# Patient Record
Sex: Female | Born: 1935 | Race: Black or African American | Hispanic: No | Smoking: Never smoker
Health system: Southern US, Community
[De-identification: ages and names within clinical notes are randomized; demographics above are authoritative.]

## PROBLEM LIST (undated history)

## (undated) DIAGNOSIS — M1990 Unspecified osteoarthritis, unspecified site: Secondary | ICD-10-CM

## (undated) DIAGNOSIS — I10 Essential (primary) hypertension: Secondary | ICD-10-CM

## (undated) DIAGNOSIS — I350 Nonrheumatic aortic (valve) stenosis: Secondary | ICD-10-CM

## (undated) DIAGNOSIS — C50911 Malignant neoplasm of unspecified site of right female breast: Secondary | ICD-10-CM

## (undated) DIAGNOSIS — E079 Disorder of thyroid, unspecified: Secondary | ICD-10-CM

## (undated) HISTORY — DX: Nonrheumatic aortic (valve) stenosis: I35.0

## (undated) HISTORY — DX: Essential (primary) hypertension: I10

## (undated) HISTORY — PX: BREAST BIOPSY: SHX20

## (undated) HISTORY — PX: INGUINAL HERNIA REPAIR: SUR1180

## (undated) HISTORY — PX: CHOLECYSTECTOMY OPEN: SUR202

## (undated) HISTORY — DX: Unspecified osteoarthritis, unspecified site: M19.90

## (undated) HISTORY — PX: CATARACT EXTRACTION W/ INTRAOCULAR LENS IMPLANT: SHX1309

## (undated) HISTORY — PX: TONSILLECTOMY AND ADENOIDECTOMY: SUR1326

## (undated) HISTORY — PX: FRACTURE SURGERY: SHX138

## (undated) HISTORY — PX: APPENDECTOMY: SHX54

## (undated) HISTORY — DX: Disorder of thyroid, unspecified: E07.9

## (undated) HISTORY — PX: ABDOMINAL HYSTERECTOMY: SHX81

---

## 1995-12-04 DIAGNOSIS — C50911 Malignant neoplasm of unspecified site of right female breast: Secondary | ICD-10-CM

## 1995-12-04 HISTORY — DX: Malignant neoplasm of unspecified site of right female breast: C50.911

## 1996-06-18 HISTORY — PX: MASTECTOMY: SHX3

## 1998-11-22 ENCOUNTER — Ambulatory Visit (HOSPITAL_COMMUNITY): Admission: RE | Admit: 1998-11-22 | Discharge: 1998-11-22 | Payer: Self-pay | Admitting: *Deleted

## 1999-11-10 ENCOUNTER — Ambulatory Visit (HOSPITAL_COMMUNITY): Admission: RE | Admit: 1999-11-10 | Discharge: 1999-11-10 | Payer: Self-pay | Admitting: Family Medicine

## 1999-11-10 ENCOUNTER — Encounter: Payer: Self-pay | Admitting: Family Medicine

## 2000-11-11 ENCOUNTER — Ambulatory Visit (HOSPITAL_COMMUNITY): Admission: RE | Admit: 2000-11-11 | Discharge: 2000-11-11 | Payer: Self-pay | Admitting: Obstetrics and Gynecology

## 2000-11-11 ENCOUNTER — Encounter: Payer: Self-pay | Admitting: Obstetrics and Gynecology

## 2001-11-13 ENCOUNTER — Ambulatory Visit (HOSPITAL_COMMUNITY): Admission: RE | Admit: 2001-11-13 | Discharge: 2001-11-13 | Payer: Self-pay | Admitting: *Deleted

## 2002-11-12 ENCOUNTER — Ambulatory Visit (HOSPITAL_COMMUNITY): Admission: RE | Admit: 2002-11-12 | Discharge: 2002-11-12 | Payer: Self-pay | Admitting: *Deleted

## 2002-12-10 ENCOUNTER — Other Ambulatory Visit: Admission: RE | Admit: 2002-12-10 | Discharge: 2002-12-10 | Payer: Self-pay | Admitting: Family Medicine

## 2003-11-15 ENCOUNTER — Ambulatory Visit (HOSPITAL_COMMUNITY): Admission: RE | Admit: 2003-11-15 | Discharge: 2003-11-15 | Payer: Self-pay | Admitting: Obstetrics and Gynecology

## 2004-11-29 ENCOUNTER — Ambulatory Visit (HOSPITAL_COMMUNITY): Admission: RE | Admit: 2004-11-29 | Discharge: 2004-11-29 | Payer: Self-pay | Admitting: Obstetrics and Gynecology

## 2005-12-18 ENCOUNTER — Ambulatory Visit (HOSPITAL_COMMUNITY): Admission: RE | Admit: 2005-12-18 | Discharge: 2005-12-18 | Payer: Self-pay | Admitting: *Deleted

## 2006-01-07 ENCOUNTER — Other Ambulatory Visit: Admission: RE | Admit: 2006-01-07 | Discharge: 2006-01-07 | Payer: Self-pay | Admitting: Obstetrics and Gynecology

## 2007-01-13 ENCOUNTER — Ambulatory Visit (HOSPITAL_COMMUNITY): Admission: RE | Admit: 2007-01-13 | Discharge: 2007-01-13 | Payer: Self-pay | Admitting: *Deleted

## 2007-01-27 ENCOUNTER — Encounter: Admission: RE | Admit: 2007-01-27 | Discharge: 2007-01-27 | Payer: Self-pay | Admitting: *Deleted

## 2008-02-20 ENCOUNTER — Ambulatory Visit (HOSPITAL_COMMUNITY): Admission: RE | Admit: 2008-02-20 | Discharge: 2008-02-20 | Payer: Self-pay | Admitting: *Deleted

## 2009-03-11 ENCOUNTER — Other Ambulatory Visit: Admission: RE | Admit: 2009-03-11 | Discharge: 2009-03-11 | Payer: Self-pay | Admitting: Family Medicine

## 2009-12-04 ENCOUNTER — Emergency Department (HOSPITAL_COMMUNITY): Admission: EM | Admit: 2009-12-04 | Discharge: 2009-12-04 | Payer: Self-pay | Admitting: Emergency Medicine

## 2010-03-02 ENCOUNTER — Encounter: Admission: RE | Admit: 2010-03-02 | Discharge: 2010-03-02 | Payer: Self-pay | Admitting: General Surgery

## 2010-03-16 ENCOUNTER — Ambulatory Visit (HOSPITAL_COMMUNITY): Admission: RE | Admit: 2010-03-16 | Discharge: 2010-03-16 | Payer: Self-pay | Admitting: General Surgery

## 2011-03-12 ENCOUNTER — Other Ambulatory Visit (HOSPITAL_COMMUNITY): Payer: Self-pay | Admitting: Obstetrics and Gynecology

## 2011-03-12 DIAGNOSIS — Z1231 Encounter for screening mammogram for malignant neoplasm of breast: Secondary | ICD-10-CM

## 2011-03-21 ENCOUNTER — Ambulatory Visit (HOSPITAL_COMMUNITY)
Admission: RE | Admit: 2011-03-21 | Discharge: 2011-03-21 | Disposition: A | Discharge status: Home / Self Care | Payer: Medicare Other | Source: Ambulatory Visit | Attending: Obstetrics and Gynecology | Admitting: Obstetrics and Gynecology

## 2011-03-21 DIAGNOSIS — Z1231 Encounter for screening mammogram for malignant neoplasm of breast: Secondary | ICD-10-CM

## 2011-07-23 ENCOUNTER — Emergency Department (HOSPITAL_COMMUNITY): Payer: Medicare Other

## 2011-07-23 ENCOUNTER — Emergency Department (HOSPITAL_COMMUNITY)
Admission: EM | Admit: 2011-07-23 | Discharge: 2011-07-23 | Disposition: A | Discharge status: Home / Self Care | Payer: Medicare Other | Attending: Emergency Medicine | Admitting: Emergency Medicine

## 2011-07-23 DIAGNOSIS — I10 Essential (primary) hypertension: Secondary | ICD-10-CM | POA: Insufficient documentation

## 2011-07-23 DIAGNOSIS — R109 Unspecified abdominal pain: Secondary | ICD-10-CM | POA: Insufficient documentation

## 2011-07-23 DIAGNOSIS — M545 Low back pain, unspecified: Secondary | ICD-10-CM | POA: Insufficient documentation

## 2011-07-23 DIAGNOSIS — S139XXA Sprain of joints and ligaments of unspecified parts of neck, initial encounter: Secondary | ICD-10-CM | POA: Insufficient documentation

## 2011-07-23 DIAGNOSIS — S335XXA Sprain of ligaments of lumbar spine, initial encounter: Secondary | ICD-10-CM | POA: Insufficient documentation

## 2011-07-23 DIAGNOSIS — M542 Cervicalgia: Secondary | ICD-10-CM | POA: Insufficient documentation

## 2011-07-23 LAB — URINALYSIS, ROUTINE W REFLEX MICROSCOPIC
Hgb urine dipstick: NEGATIVE
Nitrite: NEGATIVE
Protein, ur: NEGATIVE mg/dL
Urobilinogen, UA: 1 mg/dL (ref 0.0–1.0)

## 2011-07-23 LAB — URINE MICROSCOPIC-ADD ON

## 2012-03-25 ENCOUNTER — Encounter (INDEPENDENT_AMBULATORY_CARE_PROVIDER_SITE_OTHER): Payer: Self-pay | Admitting: General Surgery

## 2012-04-01 ENCOUNTER — Ambulatory Visit (INDEPENDENT_AMBULATORY_CARE_PROVIDER_SITE_OTHER): Payer: Medicare Other | Admitting: General Surgery

## 2012-04-01 ENCOUNTER — Encounter (INDEPENDENT_AMBULATORY_CARE_PROVIDER_SITE_OTHER): Payer: Self-pay | Admitting: General Surgery

## 2012-04-01 VITALS — BP 188/96 | HR 64 | Resp 16 | Ht <= 58 in | Wt 142.8 lb

## 2012-04-01 DIAGNOSIS — Z853 Personal history of malignant neoplasm of breast: Secondary | ICD-10-CM | POA: Insufficient documentation

## 2012-04-01 NOTE — Patient Instructions (Signed)
Continue to do regular self exams 

## 2012-04-01 NOTE — Progress Notes (Signed)
Subjective:     Patient ID: Krystal Chambers, female   DOB: Aug 24, 1935, 77 y.o.   MRN: [id number]  HPI The patient is a 77 year old black female who is 16 years out from a right modified radical mastectomy for a T2 N0 right breast cancer. In the last year she has been well. She has had no major illnesses. She does report pain and some of her joints from arthritis but no chest wall pain. Her last mammogram was done near the end of 2012 which was reported as negative. She also had an ultrasound last year her thyroid gland which shows some very small nodules that were too small to really biopsy. She has had no difficulty swallowing. She has had no hot or cold intolerance. Her medical doctor follows her thyroid function levels.  Review of Systems  Constitutional: Negative.   HENT: Negative.   Eyes: Negative.   Respiratory: Negative.   Cardiovascular: Negative.   Gastrointestinal: Negative.   Genitourinary: Negative.   Musculoskeletal: Negative.   Skin: Negative.   Neurological: Negative.   Hematological: Negative.   Psychiatric/Behavioral: Negative.        Objective:   Physical Exam  Constitutional: She is oriented to person, place, and time. She appears well-developed and well-nourished.  HENT:  Head: Normocephalic and atraumatic.  Eyes: Conjunctivae and EOM are normal.       She has a small red swollen area on her lower left eyelid  Neck: Normal range of motion. Neck supple. No thyromegaly present.  Cardiovascular: Normal rate, regular rhythm and normal heart sounds.   Pulmonary/Chest: Effort normal and breath sounds normal.       There is no palpable mass of the right chest wall. No palpable mass of the left breast. No palpable axillary supraclavicular or cervical lymphadenopathy.  Abdominal: Soft. Bowel sounds are normal. She exhibits no mass. There is no tenderness.  Musculoskeletal: Normal range of motion.  Lymphadenopathy:    She has no cervical adenopathy.  Neurological: She is  alert and oriented to person, place, and time.  Skin: Skin is warm and dry.  Psychiatric: She has a normal mood and affect. Her behavior is normal.       Assessment:     16 years out from a right modified radical mastectomy. She also has a history of very small nodules in her thyroid gland that are asymptomatic.    Plan:     Today I do not palpate any abnormalities with her thyroid gland. Her medical doctor is following her thyroid function studies. From a breast cancer standpoint she's doing very well. She will continue to do regular self exams. We will plan to see her back in one year

## 2013-02-11 ENCOUNTER — Encounter (INDEPENDENT_AMBULATORY_CARE_PROVIDER_SITE_OTHER): Payer: Self-pay | Admitting: General Surgery

## 2013-05-11 ENCOUNTER — Ambulatory Visit (INDEPENDENT_AMBULATORY_CARE_PROVIDER_SITE_OTHER): Payer: Medicare Other | Admitting: General Surgery

## 2013-05-25 ENCOUNTER — Ambulatory Visit (INDEPENDENT_AMBULATORY_CARE_PROVIDER_SITE_OTHER): Payer: Medicare Other | Admitting: General Surgery

## 2013-06-02 ENCOUNTER — Encounter (INDEPENDENT_AMBULATORY_CARE_PROVIDER_SITE_OTHER): Payer: Self-pay | Admitting: General Surgery

## 2013-06-02 ENCOUNTER — Ambulatory Visit (INDEPENDENT_AMBULATORY_CARE_PROVIDER_SITE_OTHER): Payer: Medicare Other | Admitting: General Surgery

## 2013-06-02 VITALS — BP 176/94 | HR 52 | Temp 99.1°F | Resp 16 | Ht 59.0 in | Wt 146.0 lb

## 2013-06-02 DIAGNOSIS — Z853 Personal history of malignant neoplasm of breast: Secondary | ICD-10-CM

## 2013-06-02 NOTE — Patient Instructions (Signed)
Continue regular self exams Will order mammogram

## 2013-06-02 NOTE — Progress Notes (Signed)
Subjective:     Patient ID: Krystal Chambers, female   DOB: May 15, 1935, 78 y.o.   MRN: [id number]  HPI The patient is a 78 year old black female who is 17 years status post right modified radical mastectomy for a T2 N0 right breast cancer. She has been very well for the last year. She has had no major medical problems. She denies any chest wall discomfort. Her last mammogram was in 2012.  Review of Systems  Constitutional: Negative.   HENT: Negative.   Eyes: Negative.   Respiratory: Negative.   Cardiovascular: Negative.   Gastrointestinal: Negative.   Endocrine: Negative.   Genitourinary: Negative.   Musculoskeletal: Negative.   Skin: Negative.   Allergic/Immunologic: Negative.   Neurological: Negative.   Hematological: Negative.   Psychiatric/Behavioral: Negative.        Objective:   Physical Exam  Constitutional: She is oriented to person, place, and time. She appears well-developed and well-nourished.  HENT:  Head: Normocephalic and atraumatic.  Eyes: Conjunctivae and EOM are normal. Pupils are equal, round, and reactive to light.  Neck: Normal range of motion. Neck supple.  Cardiovascular: Normal rate, regular rhythm and normal heart sounds.   Pulmonary/Chest: Effort normal and breath sounds normal.  There is no palpable mass of the right chest wall. There is no palpable mass of the left breast. There is no palpable axillary, cervical, or supraclavicular lymphadenopathy.  Abdominal: Soft. Bowel sounds are normal. She exhibits no mass. There is no tenderness.  Musculoskeletal: Normal range of motion.  Lymphadenopathy:    She has no cervical adenopathy.  Neurological: She is alert and oriented to person, place, and time.  Skin: Skin is warm and dry.  Psychiatric: She has a normal mood and affect. Her behavior is normal.       Assessment:     The patient is 17 years status post right modified radical mastectomy     Plan:     At this point she will continue to do  regular self exams. We will plan to order her mammogram so this can be done. If this shows no evidence of malignancy then we will plan to see her back in one year

## 2013-06-22 ENCOUNTER — Ambulatory Visit (HOSPITAL_COMMUNITY)
Admission: RE | Admit: 2013-06-22 | Discharge: 2013-06-22 | Disposition: A | Discharge status: Home / Self Care | Payer: Medicare Other | Source: Ambulatory Visit | Attending: General Surgery | Admitting: General Surgery

## 2013-06-22 DIAGNOSIS — Z1231 Encounter for screening mammogram for malignant neoplasm of breast: Secondary | ICD-10-CM | POA: Insufficient documentation

## 2013-06-22 DIAGNOSIS — Z853 Personal history of malignant neoplasm of breast: Secondary | ICD-10-CM

## 2013-08-02 ENCOUNTER — Emergency Department (HOSPITAL_COMMUNITY)
Admission: EM | Admit: 2013-08-02 | Discharge: 2013-08-02 | Disposition: A | Discharge status: Home / Self Care | Payer: Medicare Other | Attending: Emergency Medicine | Admitting: Emergency Medicine

## 2013-08-02 ENCOUNTER — Encounter (HOSPITAL_COMMUNITY): Payer: Self-pay | Admitting: Physical Medicine and Rehabilitation

## 2013-08-02 DIAGNOSIS — Z8639 Personal history of other endocrine, nutritional and metabolic disease: Secondary | ICD-10-CM | POA: Insufficient documentation

## 2013-08-02 DIAGNOSIS — Z9889 Other specified postprocedural states: Secondary | ICD-10-CM | POA: Insufficient documentation

## 2013-08-02 DIAGNOSIS — T18108A Unspecified foreign body in esophagus causing other injury, initial encounter: Secondary | ICD-10-CM | POA: Insufficient documentation

## 2013-08-02 DIAGNOSIS — Z862 Personal history of diseases of the blood and blood-forming organs and certain disorders involving the immune mechanism: Secondary | ICD-10-CM | POA: Insufficient documentation

## 2013-08-02 DIAGNOSIS — Y9229 Other specified public building as the place of occurrence of the external cause: Secondary | ICD-10-CM | POA: Insufficient documentation

## 2013-08-02 DIAGNOSIS — Y9389 Activity, other specified: Secondary | ICD-10-CM | POA: Insufficient documentation

## 2013-08-02 DIAGNOSIS — Z853 Personal history of malignant neoplasm of breast: Secondary | ICD-10-CM | POA: Insufficient documentation

## 2013-08-02 DIAGNOSIS — M129 Arthropathy, unspecified: Secondary | ICD-10-CM | POA: Insufficient documentation

## 2013-08-02 DIAGNOSIS — I10 Essential (primary) hypertension: Secondary | ICD-10-CM | POA: Insufficient documentation

## 2013-08-02 DIAGNOSIS — IMO0002 Reserved for concepts with insufficient information to code with codable children: Secondary | ICD-10-CM | POA: Insufficient documentation

## 2013-08-02 DIAGNOSIS — Z79899 Other long term (current) drug therapy: Secondary | ICD-10-CM | POA: Insufficient documentation

## 2013-08-02 LAB — POCT I-STAT, CHEM 8
Calcium, Ion: 1.35 mmol/L — ABNORMAL HIGH (ref 1.13–1.30)
Creatinine, Ser: 1.3 mg/dL — ABNORMAL HIGH (ref 0.50–1.10)
Glucose, Bld: 126 mg/dL — ABNORMAL HIGH (ref 70–99)
Hemoglobin: 13.6 g/dL (ref 12.0–15.0)
Potassium: 3.5 mEq/L (ref 3.5–5.1)
TCO2: 28 mmol/L (ref 0–100)

## 2013-08-02 MED ORDER — GLUCAGON HCL (RDNA) 1 MG IJ SOLR
1.0000 mg | Freq: Once | INTRAMUSCULAR | Status: AC
  Administered 2013-08-02: 1 mg via INTRAVENOUS
  Filled 2013-08-02: qty 1

## 2013-08-02 NOTE — ED Notes (Signed)
Pt presents to department for evaluation of possible food bolus. States she was eating [place] [employer] meat this afternoon, shortly after eating began having difficulty swallowing. Now states "I feel like something is lodged in my throat." respirations unlabored. Speaking complete sentences.

## 2013-08-02 NOTE — ED Provider Notes (Signed)
CSN: [id number]     Arrival date & time 08/02/13  1507 History   First MD Initiated Contact with Patient 08/02/13 1519     Chief Complaint  Patient presents with  . Swallowed Foreign Body   (Consider location/radiation/quality/duration/timing/severity/associated sxs/prior Treatment) HPI Comments: Patient presents with difficulty swallowing. She states that she ate a piece of meat after church this afternoon and felt like it got stuck in her throat. She states now whenever she swallows this it or the saliva comes right back up. She's not been able to keep any solids or liquids down following this incident. She denies any trouble breathing. She has some discomfort in her throat but denies any chest or abdominal pain. She denies a history of esophageal food boluses in the past. She denies any history of known strictures. She denies having a gastroenterologist but says that she did have a colonoscopy done about 2 years ago. She's unsure who did this procedure.  Patient is a 78 y.o. female presenting with foreign body swallowed.  Swallowed Foreign Body Pertinent negatives include no chest pain, no abdominal pain, no headaches and no shortness of breath.    Past Medical History  Diagnosis Date  . Hypertension   . History of breast cancer     right breast  . Thyroid disease   . Arthritis   . Cancer    Past Surgical History  Procedure Laterality Date  . Mastectomy  06/18/96    right  . Appendectomy    . Cholecystectomy    . Abdominal hysterectomy    . Tonsillectomy and adenoidectomy     Family History  Problem Relation Age of Onset  . Heart disease Father   . Heart disease Sister    History  Substance Use Topics  . Smoking status: Never Smoker   . Smokeless tobacco: Not on file  . Alcohol Use: No   OB History   Grav Para Term Preterm Abortions TAB SAB Ect Mult Living                 Review of Systems  Constitutional: Negative for fever, chills, diaphoresis and fatigue.   HENT: Positive for trouble swallowing. Negative for congestion, rhinorrhea and sneezing.   Eyes: Negative.   Respiratory: Negative for cough, chest tightness and shortness of breath.   Cardiovascular: Negative for chest pain and leg swelling.  Gastrointestinal: Negative for nausea, vomiting, abdominal pain, diarrhea and blood in stool.  Genitourinary: Negative for frequency, hematuria, flank pain and difficulty urinating.  Musculoskeletal: Negative for back pain and arthralgias.  Skin: Negative for rash.  Neurological: Negative for dizziness, speech difficulty, weakness, numbness and headaches.    Allergies  Review of patient's allergies indicates no known allergies.  Home Medications   Current Outpatient Rx  Name  Route  Sig  Dispense  Refill  . carvedilol (COREG) 6.25 MG tablet   Oral   Take 6.25 mg by mouth 2 (two) times daily with a meal.         . Glucosamine 500 MG CAPS   Oral   Take 500 mg by mouth 2 (two) times daily.          . irbesartan-hydrochlorothiazide (AVALIDE) 150-12.5 MG per tablet   Oral   Take 2 tablets by mouth daily.         . Multiple Vitamin (MULTIVITAMIN) capsule   Oral   Take 1 capsule by mouth daily.         . simvastatin (ZOCOR) 40 MG  tablet   Oral   Take 40 mg by mouth every evening.         . verapamil (COVERA HS) 240 MG (CO) 24 hr tablet   Oral   Take 240 mg by mouth at bedtime.          BP 115/55  Pulse 50  Temp(Src) 98.3 F (36.8 C) (Oral)  Resp 18  SpO2 100% Physical Exam  Constitutional: She is oriented to person, place, and time. She appears well-developed and well-nourished.  HENT:  Head: Normocephalic and atraumatic.  Eyes: Pupils are equal, round, and reactive to light.  Neck: Normal range of motion. Neck supple.  Cardiovascular: Normal rate, regular rhythm and normal heart sounds.   Pulmonary/Chest: Effort normal and breath sounds normal. No respiratory distress. She has no wheezes. She has no rales. She  exhibits no tenderness.  Abdominal: Soft. Bowel sounds are normal. There is no tenderness. There is no rebound and no guarding.  Musculoskeletal: Normal range of motion. She exhibits no edema.  Lymphadenopathy:    She has no cervical adenopathy.  Neurological: She is alert and oriented to person, place, and time.  Skin: Skin is warm and dry. No rash noted.  Psychiatric: She has a normal mood and affect.    ED Course  Procedures (including critical care time) Labs Review Labs Reviewed  POCT I-STAT, CHEM 8 - Abnormal; Notable for the following:    Creatinine, Ser 1.30 (*)    Glucose, Bld 126 (*)    Calcium, Ion 1.35 (*)    All other components within normal limits   Imaging Review No results found.  MDM   1. Esophageal foreign body, initial encounter    Patient was given glucagon. Following this she was able to swallow down liquids without problem. She felt like the bolus had resolved. She was able to eat crackers and continue drinking with no problem. She was discharged home in good condition. I gave her an outpatient referral to followup with GI. Her creatinine was mildly elevated. We have no old baseline labs. I advised her that she'll need to have this as well as her glucose rechecked by her primary care physician.    Rolan Bucco, MD 08/02/13 [id number]

## 2013-08-02 NOTE — ED Notes (Signed)
Patient discharged to home with family. NAD.  

## 2014-06-08 ENCOUNTER — Encounter (INDEPENDENT_AMBULATORY_CARE_PROVIDER_SITE_OTHER): Payer: Self-pay | Admitting: General Surgery

## 2016-12-10 ENCOUNTER — Ambulatory Visit: Payer: Self-pay | Admitting: Cardiology

## 2016-12-12 ENCOUNTER — Encounter: Payer: Self-pay | Admitting: Cardiology

## 2016-12-24 ENCOUNTER — Encounter: Payer: Self-pay | Admitting: Cardiology

## 2017-07-30 ENCOUNTER — Inpatient Hospital Stay (HOSPITAL_COMMUNITY): Payer: Medicare Other | Admitting: Anesthesiology

## 2017-07-30 ENCOUNTER — Emergency Department (HOSPITAL_COMMUNITY): Payer: Medicare Other

## 2017-07-30 ENCOUNTER — Encounter (HOSPITAL_COMMUNITY)
Admission: EM | Disposition: A | Discharge status: Skilled Nursing Facility | Payer: Self-pay | Source: Home / Self Care | Attending: Family Medicine

## 2017-07-30 ENCOUNTER — Inpatient Hospital Stay (HOSPITAL_COMMUNITY)
Admission: EM | Admit: 2017-07-30 | Discharge: 2017-08-01 | DRG: 482 | Disposition: A | Discharge status: Skilled Nursing Facility | Payer: Medicare Other | Attending: Family Medicine | Admitting: Family Medicine

## 2017-07-30 ENCOUNTER — Encounter (HOSPITAL_COMMUNITY): Payer: Self-pay

## 2017-07-30 ENCOUNTER — Inpatient Hospital Stay (HOSPITAL_COMMUNITY): Admission: RE | Admit: 2017-07-30 | Payer: Medicare Other | Source: Ambulatory Visit | Admitting: Orthopedic Surgery

## 2017-07-30 ENCOUNTER — Inpatient Hospital Stay (HOSPITAL_COMMUNITY): Payer: Medicare Other

## 2017-07-30 DIAGNOSIS — W11XXXA Fall on and from ladder, initial encounter: Secondary | ICD-10-CM | POA: Diagnosis present

## 2017-07-30 DIAGNOSIS — I10 Essential (primary) hypertension: Secondary | ICD-10-CM | POA: Diagnosis present

## 2017-07-30 DIAGNOSIS — Z419 Encounter for procedure for purposes other than remedying health state, unspecified: Secondary | ICD-10-CM

## 2017-07-30 DIAGNOSIS — Z8639 Personal history of other endocrine, nutritional and metabolic disease: Secondary | ICD-10-CM | POA: Diagnosis not present

## 2017-07-30 DIAGNOSIS — M1990 Unspecified osteoarthritis, unspecified site: Secondary | ICD-10-CM | POA: Diagnosis present

## 2017-07-30 DIAGNOSIS — M810 Age-related osteoporosis without current pathological fracture: Secondary | ICD-10-CM | POA: Diagnosis present

## 2017-07-30 DIAGNOSIS — Z9010 Acquired absence of unspecified breast and nipple: Secondary | ICD-10-CM

## 2017-07-30 DIAGNOSIS — W19XXXA Unspecified fall, initial encounter: Secondary | ICD-10-CM

## 2017-07-30 DIAGNOSIS — E785 Hyperlipidemia, unspecified: Secondary | ICD-10-CM | POA: Diagnosis present

## 2017-07-30 DIAGNOSIS — E079 Disorder of thyroid, unspecified: Secondary | ICD-10-CM | POA: Diagnosis present

## 2017-07-30 DIAGNOSIS — S72142A Displaced intertrochanteric fracture of left femur, initial encounter for closed fracture: Principal | ICD-10-CM | POA: Diagnosis present

## 2017-07-30 DIAGNOSIS — Z8249 Family history of ischemic heart disease and other diseases of the circulatory system: Secondary | ICD-10-CM

## 2017-07-30 DIAGNOSIS — Z853 Personal history of malignant neoplasm of breast: Secondary | ICD-10-CM | POA: Diagnosis not present

## 2017-07-30 DIAGNOSIS — Z79899 Other long term (current) drug therapy: Secondary | ICD-10-CM

## 2017-07-30 DIAGNOSIS — Z90710 Acquired absence of both cervix and uterus: Secondary | ICD-10-CM | POA: Diagnosis not present

## 2017-07-30 DIAGNOSIS — D72829 Elevated white blood cell count, unspecified: Secondary | ICD-10-CM | POA: Diagnosis present

## 2017-07-30 DIAGNOSIS — Y92009 Unspecified place in unspecified non-institutional (private) residence as the place of occurrence of the external cause: Secondary | ICD-10-CM

## 2017-07-30 DIAGNOSIS — D696 Thrombocytopenia, unspecified: Secondary | ICD-10-CM | POA: Diagnosis present

## 2017-07-30 DIAGNOSIS — M25552 Pain in left hip: Secondary | ICD-10-CM | POA: Diagnosis present

## 2017-07-30 DIAGNOSIS — D649 Anemia, unspecified: Secondary | ICD-10-CM | POA: Diagnosis present

## 2017-07-30 HISTORY — DX: Malignant neoplasm of unspecified site of right female breast: C50.911

## 2017-07-30 HISTORY — PX: INTRAMEDULLARY (IM) NAIL INTERTROCHANTERIC: SHX5875

## 2017-07-30 LAB — POCT I-STAT, CHEM 8
BUN: 11 mg/dL (ref 6–20)
Calcium, Ion: 1.19 mmol/L (ref 1.15–1.40)
Chloride: 109 mmol/L (ref 101–111)
Creatinine, Ser: 0.9 mg/dL (ref 0.44–1.00)
Glucose, Bld: 128 mg/dL — ABNORMAL HIGH (ref 65–99)
HEMATOCRIT: 41 % (ref 36.0–46.0)
HEMOGLOBIN: 13.9 g/dL (ref 12.0–15.0)
POTASSIUM: 4.3 mmol/L (ref 3.5–5.1)
SODIUM: 142 mmol/L (ref 135–145)
TCO2: 25 mmol/L (ref 22–32)

## 2017-07-30 LAB — CBC WITH DIFFERENTIAL/PLATELET
Basophils Absolute: 0 K/uL (ref 0.0–0.1)
Basophils Relative: 0 %
Eosinophils Absolute: 0.1 K/uL (ref 0.0–0.7)
Eosinophils Relative: 1 %
HCT: 37 % (ref 36.0–46.0)
Hemoglobin: 11.6 g/dL — ABNORMAL LOW (ref 12.0–15.0)
Lymphocytes Relative: 17 %
Lymphs Abs: 1.6 K/uL (ref 0.7–4.0)
MCH: 27.4 pg (ref 26.0–34.0)
MCHC: 31.4 g/dL (ref 30.0–36.0)
MCV: 87.5 fL (ref 78.0–100.0)
Monocytes Absolute: 0.8 K/uL (ref 0.1–1.0)
Monocytes Relative: 8 %
Neutro Abs: 7.2 K/uL (ref 1.7–7.7)
Neutrophils Relative %: 74 %
Platelets: 121 K/uL — ABNORMAL LOW (ref 150–400)
RBC: 4.23 MIL/uL (ref 3.87–5.11)
RDW: 13.9 % (ref 11.5–15.5)
WBC: 9.7 K/uL (ref 4.0–10.5)

## 2017-07-30 SURGERY — FIXATION, FRACTURE, INTERTROCHANTERIC, WITH INTRAMEDULLARY ROD
Anesthesia: General | Site: Hip | Laterality: Left

## 2017-07-30 MED ORDER — ACETAMINOPHEN 650 MG RE SUPP: 650.0000 mg | Freq: Four times a day (QID) | RECTAL | Status: DC | PRN

## 2017-07-30 MED ORDER — CHLORHEXIDINE GLUCONATE 4 % EX LIQD: 60.0000 mL | Freq: Once | CUTANEOUS | Status: DC

## 2017-07-30 MED ORDER — ONDANSETRON HCL 4 MG PO TABS: 4.0000 mg | ORAL_TABLET | Freq: Four times a day (QID) | ORAL | Status: DC | PRN

## 2017-07-30 MED ORDER — LACTATED RINGERS IV SOLN
INTRAVENOUS | Status: DC
  Administered 2017-07-30 (×2): via INTRAVENOUS

## 2017-07-30 MED ORDER — CARVEDILOL 6.25 MG PO TABS
6.2500 mg | ORAL_TABLET | Freq: Two times a day (BID) | ORAL | Status: DC
  Administered 2017-07-31 (×2): 6.25 mg via ORAL
  Filled 2017-07-30 (×3): qty 1

## 2017-07-30 MED ORDER — FENTANYL CITRATE (PF) 100 MCG/2ML IJ SOLN
25.0000 ug | INTRAMUSCULAR | Status: DC | PRN
  Administered 2017-07-30 (×3): 25 ug via INTRAVENOUS

## 2017-07-30 MED ORDER — SODIUM CHLORIDE 0.9 % IV SOLN
INTRAVENOUS | Status: DC
  Administered 2017-07-31: 01:00:00 via INTRAVENOUS

## 2017-07-30 MED ORDER — FENTANYL CITRATE (PF) 250 MCG/5ML IJ SOLN
INTRAMUSCULAR | Status: AC
  Filled 2017-07-30: qty 5

## 2017-07-30 MED ORDER — PROPOFOL 10 MG/ML IV BOLUS
INTRAVENOUS | Status: DC | PRN
  Administered 2017-07-30: 110 mg via INTRAVENOUS
  Administered 2017-07-30: 40 mg via INTRAVENOUS

## 2017-07-30 MED ORDER — OXYCODONE HCL 5 MG/5ML PO SOLN: 5.0000 mg | Freq: Once | ORAL | Status: DC | PRN

## 2017-07-30 MED ORDER — ONDANSETRON HCL 4 MG/2ML IJ SOLN: 4.0000 mg | Freq: Four times a day (QID) | INTRAMUSCULAR | Status: DC | PRN

## 2017-07-30 MED ORDER — ONDANSETRON HCL 4 MG/2ML IJ SOLN
INTRAMUSCULAR | Status: AC
  Filled 2017-07-30: qty 2

## 2017-07-30 MED ORDER — ROCURONIUM 10MG/ML (10ML) SYRINGE FOR MEDFUSION PUMP - OPTIME
INTRAVENOUS | Status: DC | PRN
  Administered 2017-07-30: 40 mg via INTRAVENOUS

## 2017-07-30 MED ORDER — HYDRALAZINE HCL 20 MG/ML IJ SOLN
10.0000 mg | Freq: Once | INTRAMUSCULAR | Status: AC
  Administered 2017-07-30: 10 mg via INTRAVENOUS

## 2017-07-30 MED ORDER — ACETAMINOPHEN 325 MG PO TABS
650.0000 mg | ORAL_TABLET | Freq: Four times a day (QID) | ORAL | Status: DC | PRN
  Administered 2017-07-31 – 2017-08-01 (×2): 650 mg via ORAL
  Filled 2017-07-30 (×2): qty 2

## 2017-07-30 MED ORDER — CEFAZOLIN SODIUM-DEXTROSE 2-4 GM/100ML-% IV SOLN
INTRAVENOUS | Status: AC
  Filled 2017-07-30: qty 100

## 2017-07-30 MED ORDER — DOCUSATE SODIUM 100 MG PO CAPS
100.0000 mg | ORAL_CAPSULE | Freq: Two times a day (BID) | ORAL | Status: DC
  Administered 2017-07-31 – 2017-08-01 (×3): 100 mg via ORAL
  Filled 2017-07-30 (×3): qty 1

## 2017-07-30 MED ORDER — MORPHINE SULFATE (PF) 4 MG/ML IV SOLN
4.0000 mg | Freq: Once | INTRAVENOUS | Status: AC
  Administered 2017-07-30: 4 mg via INTRAVENOUS
  Filled 2017-07-30: qty 1

## 2017-07-30 MED ORDER — OXYCODONE HCL 5 MG PO TABS
5.0000 mg | ORAL_TABLET | ORAL | Status: DC | PRN
  Administered 2017-07-31: 5 mg via ORAL
  Filled 2017-07-30: qty 1

## 2017-07-30 MED ORDER — HYDRALAZINE HCL 20 MG/ML IJ SOLN
5.0000 mg | Freq: Once | INTRAMUSCULAR | Status: AC
  Administered 2017-07-30: 5 mg via INTRAVENOUS

## 2017-07-30 MED ORDER — SUGAMMADEX SODIUM 200 MG/2ML IV SOLN
INTRAVENOUS | Status: DC | PRN
  Administered 2017-07-30: 200 mg via INTRAVENOUS

## 2017-07-30 MED ORDER — PROMETHAZINE HCL 25 MG/ML IJ SOLN
INTRAMUSCULAR | Status: AC
  Filled 2017-07-30: qty 1

## 2017-07-30 MED ORDER — ENOXAPARIN SODIUM 40 MG/0.4ML ~~LOC~~ SOLN
40.0000 mg | SUBCUTANEOUS | Status: DC
  Administered 2017-07-31 – 2017-08-01 (×2): 40 mg via SUBCUTANEOUS
  Filled 2017-07-30 (×2): qty 0.4

## 2017-07-30 MED ORDER — PROPOFOL 10 MG/ML IV BOLUS
INTRAVENOUS | Status: AC
  Filled 2017-07-30: qty 20

## 2017-07-30 MED ORDER — HYDRALAZINE HCL 20 MG/ML IJ SOLN
INTRAMUSCULAR | Status: AC
  Filled 2017-07-30: qty 1

## 2017-07-30 MED ORDER — SIMVASTATIN 40 MG PO TABS
40.0000 mg | ORAL_TABLET | Freq: Every day | ORAL | Status: DC
  Administered 2017-07-31: 40 mg via ORAL
  Filled 2017-07-30: qty 1

## 2017-07-30 MED ORDER — PROMETHAZINE HCL 25 MG/ML IJ SOLN
6.2500 mg | Freq: Once | INTRAMUSCULAR | Status: AC
  Administered 2017-07-30: 6.25 mg via INTRAVENOUS

## 2017-07-30 MED ORDER — POVIDONE-IODINE 10 % EX SWAB: 2.0000 "application " | Freq: Once | CUTANEOUS | Status: DC

## 2017-07-30 MED ORDER — ADULT MULTIVITAMIN W/MINERALS CH
1.0000 | ORAL_TABLET | Freq: Every day | ORAL | Status: DC
  Administered 2017-07-31 – 2017-08-01 (×2): 1 via ORAL
  Filled 2017-07-30 (×2): qty 1

## 2017-07-30 MED ORDER — FENTANYL CITRATE (PF) 250 MCG/5ML IJ SOLN
INTRAMUSCULAR | Status: DC | PRN
  Administered 2017-07-30 (×2): 100 ug via INTRAVENOUS
  Administered 2017-07-30: 50 ug via INTRAVENOUS

## 2017-07-30 MED ORDER — FENTANYL CITRATE (PF) 100 MCG/2ML IJ SOLN
INTRAMUSCULAR | Status: AC
  Filled 2017-07-30: qty 2

## 2017-07-30 MED ORDER — MORPHINE SULFATE (PF) 4 MG/ML IV SOLN: 4.0000 mg | INTRAVENOUS | Status: DC | PRN

## 2017-07-30 MED ORDER — DIPHENHYDRAMINE HCL 12.5 MG/5ML PO ELIX: 12.5000 mg | ORAL_SOLUTION | ORAL | Status: DC | PRN

## 2017-07-30 MED ORDER — OXYCODONE HCL 5 MG PO TABS: 5.0000 mg | ORAL_TABLET | Freq: Once | ORAL | Status: DC | PRN

## 2017-07-30 MED ORDER — SENNA 8.6 MG PO TABS
1.0000 | ORAL_TABLET | Freq: Two times a day (BID) | ORAL | Status: DC
  Administered 2017-07-31 – 2017-08-01 (×3): 8.6 mg via ORAL
  Filled 2017-07-30 (×3): qty 1

## 2017-07-30 MED ORDER — CEFAZOLIN SODIUM-DEXTROSE 2-4 GM/100ML-% IV SOLN
2.0000 g | INTRAVENOUS | Status: AC
  Administered 2017-07-30: 2 g via INTRAVENOUS

## 2017-07-30 MED ORDER — ACETAMINOPHEN 325 MG PO TABS: 650.0000 mg | ORAL_TABLET | Freq: Four times a day (QID) | ORAL | Status: DC | PRN

## 2017-07-30 MED ORDER — ONDANSETRON HCL 4 MG/2ML IJ SOLN
4.0000 mg | Freq: Once | INTRAMUSCULAR | Status: AC
  Administered 2017-07-30: 4 mg via INTRAVENOUS
  Filled 2017-07-30: qty 2

## 2017-07-30 MED ORDER — ONDANSETRON HCL 4 MG/2ML IJ SOLN
INTRAMUSCULAR | Status: DC | PRN
  Administered 2017-07-30: 4 mg via INTRAVENOUS

## 2017-07-30 MED ORDER — SORBITOL 70 % SOLN: 30.0000 mL | Freq: Every day | Status: DC | PRN

## 2017-07-30 MED ORDER — OXYCODONE HCL 5 MG PO TABS: 5.0000 mg | ORAL_TABLET | ORAL | Status: DC | PRN

## 2017-07-30 MED ORDER — VERAPAMIL HCL ER 240 MG PO TBCR
240.0000 mg | EXTENDED_RELEASE_TABLET | Freq: Two times a day (BID) | ORAL | Status: DC
  Administered 2017-07-31 – 2017-08-01 (×3): 240 mg via ORAL
  Filled 2017-07-30 (×4): qty 1

## 2017-07-30 MED ORDER — 0.9 % SODIUM CHLORIDE (POUR BTL) OPTIME
TOPICAL | Status: DC | PRN
  Administered 2017-07-30: 1000 mL

## 2017-07-30 MED ORDER — SENNOSIDES-DOCUSATE SODIUM 8.6-50 MG PO TABS: 1.0000 | ORAL_TABLET | Freq: Every day | ORAL | Status: DC

## 2017-07-30 MED ORDER — DEXAMETHASONE SODIUM PHOSPHATE 10 MG/ML IJ SOLN
INTRAMUSCULAR | Status: DC | PRN
  Administered 2017-07-30: 10 mg via INTRAVENOUS

## 2017-07-30 MED ORDER — SUCCINYLCHOLINE 20MG/ML (10ML) SYRINGE FOR MEDFUSION PUMP - OPTIME: INTRAMUSCULAR | Status: DC | PRN

## 2017-07-30 MED ORDER — MORPHINE SULFATE (PF) 4 MG/ML IV SOLN: 1.0000 mg | INTRAVENOUS | Status: DC | PRN

## 2017-07-30 MED ORDER — LIDOCAINE HCL (CARDIAC) 20 MG/ML IV SOLN
INTRAVENOUS | Status: DC | PRN
  Administered 2017-07-30: 100 mg via INTRATRACHEAL

## 2017-07-30 MED ORDER — ONDANSETRON HCL 4 MG/2ML IJ SOLN
4.0000 mg | Freq: Four times a day (QID) | INTRAMUSCULAR | Status: AC | PRN
  Administered 2017-07-30: 4 mg via INTRAVENOUS

## 2017-07-30 SURGICAL SUPPLY — 41 items
BIT DRILL 4.3MMS DISTAL GRDTED (BIT) ×1 IMPLANT
BLADE SURG 15 STRL LF DISP TIS (BLADE) ×1 IMPLANT
BLADE SURG 15 STRL SS (BLADE) ×2
CANISTER SUCT 3000ML PPV (MISCELLANEOUS) ×3 IMPLANT
CHLORAPREP W/TINT 26ML (MISCELLANEOUS) ×3 IMPLANT
COVER MAYO STAND STRL (DRAPES) ×3 IMPLANT
COVER PERINEAL POST (MISCELLANEOUS) ×3 IMPLANT
COVER SURGICAL LIGHT HANDLE (MISCELLANEOUS) ×3 IMPLANT
DRAPE STERI IOBAN 125X83 (DRAPES) ×3 IMPLANT
DRAPE U-SHAPE 47X51 STRL (DRAPES) ×3 IMPLANT
DRILL 4.3MMS DISTAL GRADUATED (BIT) ×3
DRSG MEPILEX BORDER 4X4 (GAUZE/BANDAGES/DRESSINGS) ×6 IMPLANT
DRSG MEPILEX BORDER 4X8 (GAUZE/BANDAGES/DRESSINGS) ×3 IMPLANT
ELECT REM PT RETURN 9FT ADLT (ELECTROSURGICAL) ×3
ELECTRODE REM PT RTRN 9FT ADLT (ELECTROSURGICAL) ×1 IMPLANT
GLOVE BIO SURGEON STRL SZ7 (GLOVE) ×3 IMPLANT
GLOVE BIO SURGEON STRL SZ8 (GLOVE) ×3 IMPLANT
GLOVE BIOGEL PI IND STRL 8 (GLOVE) ×1 IMPLANT
GLOVE BIOGEL PI INDICATOR 8 (GLOVE) ×2
GOWN STRL REUS W/ TWL LRG LVL3 (GOWN DISPOSABLE) ×1 IMPLANT
GOWN STRL REUS W/ TWL XL LVL3 (GOWN DISPOSABLE) ×2 IMPLANT
GOWN STRL REUS W/TWL LRG LVL3 (GOWN DISPOSABLE) ×2
GOWN STRL REUS W/TWL XL LVL3 (GOWN DISPOSABLE) ×4
GUIDEPIN 3.2X17.5 THRD DISP (PIN) ×3 IMPLANT
GUIDEWIRE BALL NOSE 100CM (WIRE) ×3 IMPLANT
HIP FRAC NAIL LAG SCR 10.5X100 (Orthopedic Implant) ×2 IMPLANT
KIT BASIN OR (CUSTOM PROCEDURE TRAY) ×3 IMPLANT
KIT ROOM TURNOVER OR (KITS) ×3 IMPLANT
LINER BOOT UNIVERSAL DISP (MISCELLANEOUS) IMPLANT
NAIL HIP FRA AFFIX 130X9X340 L (Nail) ×3 IMPLANT
NS IRRIG 1000ML POUR BTL (IV SOLUTION) ×3 IMPLANT
PACK GENERAL/GYN (CUSTOM PROCEDURE TRAY) ×3 IMPLANT
PAD ARMBOARD 7.5X6 YLW CONV (MISCELLANEOUS) ×6 IMPLANT
SCREW BONE CORTICAL 5.0X36 (Screw) ×3 IMPLANT
SCREW CANN THRD AFF 10.5X100 (Orthopedic Implant) ×1 IMPLANT
SPONGE LAP 18X18 X RAY DECT (DISPOSABLE) ×3 IMPLANT
STAPLER VISISTAT 35W (STAPLE) IMPLANT
SUT MNCRL AB 3-0 PS2 18 (SUTURE) ×3 IMPLANT
SUT VIC AB 0 CT1 27 (SUTURE) ×2
SUT VIC AB 0 CT1 27XBRD ANBCTR (SUTURE) ×1 IMPLANT
TRAY FOLEY W/METER SILVER 16FR (SET/KITS/TRAYS/PACK) ×3 IMPLANT

## 2017-07-30 NOTE — ED Notes (Signed)
Report called to OR RN. Patient will be taken to short stay by this RN

## 2017-07-30 NOTE — Op Note (Signed)
07/30/2017  8:50 PM  PATIENT:  Krystal Chambers  82 y.o. female  PRE-OPERATIVE DIAGNOSIS: left hip intertrochanteric fracture  POST-OPERATIVE DIAGNOSIS:  same  Procedure(s):  Open treatment of left hip intertrochanteric fracture with intramedullary nailing  SURGEON:  Wylene Simmer, MD  ASSISTANT: Mechele Claude, PA-C  ANESTHESIA:   General  EBL:  100 cc  TOURNIQUET:  n/a  COMPLICATIONS:  None apparent  DISPOSITION:  Extubated, awake and stable to recovery.  INDICATION FOR PROCEDURE: The patient is a 82 year old female without significant past medical history. She fell from a ladder from the second step up earlier this afternoon. She landed on her left hip and noted immediate pain. She was brought to the emergency department where radiographs revealed an intertrochanteric fracture. She presents now for operative treatment of this displaced and unstable femur fracture.  The risks and benefits of the alternative treatment options have been discussed in detail.  The patient wishes to proceed with surgery and specifically understands risks of bleeding, infection, nerve damage, blood clots, need for additional surgery, amputation and death.  PROCEDURE IN DETAIL:After pre operative consent was obtained, and the correct operative site was identified, the patient was brought to the operating room and placed supine on the OR table.  Anesthesia was administered.  Pre-operative antibiotics were administered.  A surgical timeout was taken.  The left lower extremity was placed in a padded traction boot. The right lower extremity was placed in a well leg holder. The left lower extremity was then prepped and draped in standard sterile fashion. A longitudinal incision was made to the greater trochanter. Sharp dissection was carried down through skin subtenon's tissue. The deep fascia was incised and blunt dissection was carried down to the tip of the greater trochanter. AP and lateral fluoroscopic views were  obtained. The guidepin was advanced into the femoral canal. A starter awl was inserted over the guidepin and used to open the femoral canal. A ball-tipped guidewire was then advanced into the medullary canal to the superior pole of the patella. Ray grafts confirmed appropriate position of the guidepin. Guidepin was then used to ream the medullary canal to the diameter of two a half millimeters. A 9 mm Biomet Affixus nail was then inserted over the guide wire and advanced to the tip the greater trochanter. The guidepin for a lag screw was then inserted through a stab incision in the lateral aspect of the femur under fluoroscopic guidance. A 100 mm meter guidepin was then inserted over the guidewire and seated appropriately in the center of the femoral head. The compression wheel was then tightened compressing the fracture site appropriately. AP and lateral radiographs show appropriate alignment of the fractures and appropriate position and length of the hardware.  Attention was then turned distal aspect of the femur. The perfect circle technique was used to insert a single interlocking screw through the static hole distal end of the nail. AP and lateral radiographs showed appropriate position of the interlock screw and the tip of the nail. All 3 wounds were then irrigated copiously. Fascia was repaired with 0 Vicryl.  Subcutaneous tissue were approximated with Monocryl.  The skin incisions were closed with nylon. Sterile dressings were applied. Patient was awakened from anesthesia and transported to the recovery room in stable condition.    FOLLOW UP PLAN:  Patient will be admitted to the triad hospitalist service. She will be weightbearing as tolerated on the left lower extremity. Physical therapy and outpatient therapy consultations will be obtained tomorrow. She'll  start DVT prophylaxis tomorrow.    Mechele Claude PA-C was present and scrubbed for the duration of the operative case. His assistance assistance  was essential in positioning the patient, prepping and draping, gaining maintaining exposure, performing the operation, closing and dressing the wounds and applying the splint.

## 2017-07-30 NOTE — Consult Note (Signed)
Reason for Consult:Hip fx Referring Physician: [name] is an 82 y.o. female.  HPI: Krystal Chambers was 2 steps up on a stepladder when she fell onto her left hip. She had immediate pain and was unable to get up. She was evaluated in the ED where x-rays showed a left intertroch hip fx and orthopedic surgery was consulted.  Past Medical History:  Diagnosis Date  . Arthritis   . Cancer ([place])   . History of breast cancer    right breast  . Hypertension   . Thyroid disease     Past Surgical History:  Procedure Laterality Date  . ABDOMINAL HYSTERECTOMY    . APPENDECTOMY    . CHOLECYSTECTOMY    . MASTECTOMY  06/18/96   right  . TONSILLECTOMY AND ADENOIDECTOMY      Family History  Problem Relation Age of Onset  . Heart disease Father   . Heart disease Sister     Social History:  reports that she has never smoked. She does not have any smokeless tobacco history on file. She reports that she does not drink alcohol or use drugs.  Allergies: No Known Allergies  Medications: I have reviewed the patient's current medications.  No results found for this or any previous visit (from the past 48 hour(s)).  Dg Hip Unilat With Pelvis 2-3 Views Left  Result Date: 07/30/2017 CLINICAL DATA:  Recent fall with left hip pain EXAM: DG HIP (WITH OR WITHOUT PELVIS) 2-3V LEFT COMPARISON:  Lumbar spine films of 07/23/2011 FINDINGS: There is an acute angulated left intertrochanteric femoral fracture present with avulsion of the lesser trochanter. No other abnormality is seen. The pelvic rami are intact. Calcified uterine fibroids are noted in the mid pelvis. IMPRESSION: Acute angulated left femoral intertrochanteric fracture. Electronically Signed   By: Ivar Drape M.D.   On: 07/30/2017 15:10    Review of Systems  Constitutional: Negative for weight loss.  HENT: Negative for ear discharge, ear pain, hearing loss and tinnitus.   Eyes: Negative for blurred vision, double vision, photophobia and  pain.  Respiratory: Negative for cough, sputum production and shortness of breath.   Cardiovascular: Negative for chest pain.  Gastrointestinal: Negative for abdominal pain, nausea and vomiting.  Genitourinary: Negative for dysuria, flank pain, frequency and urgency.  Musculoskeletal: Positive for joint pain (Left hip). Negative for back pain, falls, myalgias and neck pain.  Neurological: Negative for dizziness, tingling, sensory change, focal weakness, loss of consciousness and headaches.  Endo/Heme/Allergies: Does not bruise/bleed easily.  Psychiatric/Behavioral: Negative for depression, memory loss and substance abuse. The patient is not nervous/anxious.    Blood pressure (!) 169/74, pulse 76, temperature 97.8 F (36.6 C), temperature source Oral, resp. rate 18, height 4\' 10"  (1.473 m), weight 67.1 kg (148 lb), SpO2 99 %. Physical Exam  Constitutional: She appears well-developed and well-nourished. No distress.  HENT:  Head: Normocephalic.  Eyes: Conjunctivae are normal. Right eye exhibits no discharge. Left eye exhibits no discharge. No scleral icterus.  Cardiovascular: Normal rate and regular rhythm.   Respiratory: Effort normal. No respiratory distress.  Musculoskeletal:  LLE No traumatic wounds, ecchymosis, or rash  TTP hip  No knee or ankle effusion  Knee stable to varus/ valgus and anterior/posterior stress  Sens DPN, SPN, TN intact  Motor EHL, ext, flex, evers 5/5  DP 2+, PT 2+, No significant edema  Neurological: She is alert.  Skin: Skin is warm and dry. She is not diaphoretic.  Psychiatric: She has a normal  mood and affect. Her behavior is normal.    Assessment/Plan: Left intertroch hip fx -- Will need ORIF this evening by Dr. Doran Durand. NPO, NWB until then. IM to admit.    Lisette Abu, PA-C Orthopedic Surgery [phone] 07/30/2017, 3:40 PM

## 2017-07-30 NOTE — Discharge Instructions (Addendum)
Krystal Simmer, MD [facility]  Please read the following information regarding your care after surgery.  Medications  You only need a prescription for the narcotic pain medicine (ex. oxycodone, Percocet, Norco).  All of the other medicines listed below are available over the counter. X acetominophen (Tylenol) 650 mg every 4-6 hours as you need for minor pain X tramadol as prescribed for moderate to severe pain X Aleve 2 pills twice a day for the first 3 days after surgery.  Narcotic pain medicine (ex. oxycodone, Percocet, Vicodin) will cause constipation.  To prevent this problem, take the following medicines while you are taking any pain medicine. X docusate sodium (Colace) 100 mg twice a day X senna (Senokot) 2 tablets twice a day  X To help prevent blood clots, inject lovenox subcutaneously for two weeks after surgery.  You should also get up every hour while you are awake to move around.    Weight Bearing X Bear weight when you are able on your operated leg or foot. Rolling walker as needed.   Dressing X Keep your dressing clean and dry.  Dont put anything (coat hanger, pencil, etc) down inside of it.  If it gets damp, use a hair dryer on the cool setting to dry it.  If it gets soaked, call the office to schedule an appointment for a dressing change.     After your dressing, cast or splint is removed; you may shower, but do not soak or scrub the wound.  Allow the water to run over it, and then gently pat it dry.  Swelling It is normal for you to have swelling where you had surgery.  To reduce swelling and pain, keep your toes above your nose for at least 3 days after surgery.  It may be necessary to keep your foot or leg elevated for several weeks.  If it hurts, it should be elevated.  Follow Up Call my office at [phone] when you are discharged from the hospital or surgery center to schedule an appointment to be seen two weeks after surgery.  Call my office at  [phone] if you develop a fever >101.5 F, nausea, vomiting, bleeding from the surgical site or severe pain.

## 2017-07-30 NOTE — Anesthesia Preprocedure Evaluation (Signed)
Anesthesia Evaluation  Patient identified by MRN, date of birth, ID band Patient awake    Reviewed: Allergy & Precautions, H&P , NPO status , Patient's Chart, lab work & pertinent test results  Airway Mallampati: II   Neck ROM: full    Dental   Pulmonary neg pulmonary ROS,    breath sounds clear to auscultation       Cardiovascular hypertension,  Rhythm:regular Rate:Normal     Neuro/Psych    GI/Hepatic   Endo/Other    Renal/GU      Musculoskeletal  (+) Arthritis ,   Abdominal   Peds  Hematology   Anesthesia Other Findings   Reproductive/Obstetrics                             Anesthesia Physical Anesthesia Plan  ASA: II  Anesthesia Plan: General   Post-op Pain Management:    Induction: Intravenous  PONV Risk Score and Plan: 3 and Ondansetron, Dexamethasone and Treatment may vary due to age or medical condition  Airway Management Planned: Oral ETT  Additional Equipment:   Intra-op Plan:   Post-operative Plan: Extubation in OR  Informed Consent: I have reviewed the patients History and Physical, chart, labs and discussed the procedure including the risks, benefits and alternatives for the proposed anesthesia with the patient or authorized representative who has indicated his/her understanding and acceptance.     Plan Discussed with: CRNA, Anesthesiologist and Surgeon  Anesthesia Plan Comments:         Anesthesia Quick Evaluation

## 2017-07-30 NOTE — Progress Notes (Signed)
Attempted report 

## 2017-07-30 NOTE — ED Notes (Signed)
Patient transported to X-ray 

## 2017-07-30 NOTE — ED Notes (Signed)
OR desk contacted and they have one patient on the table.  Will call again to take patient upstairs.

## 2017-07-30 NOTE — ED Provider Notes (Signed)
[place] DEPT Provider Note   CSN: [id number] Arrival date & time: 07/30/17  1414     History   Chief Complaint Chief Complaint  Patient presents with  . Fall    HPI Krystal Chambers is a 82 y.o. female.  HPI 82 year old African-American female past medical history significant for hypertension, breast cancer, arthritis, osteoporosis presents to the emergency Department today with complaints of left hip pain by EMS. Patient states that she was at home when she was trying to change a filter in her furnace. States that she fell off of the first step and landed onto her left hip. The patient has not been ambulatory since the event. Patient was given fentanyl in route by EMS that helped her pain. Moving makes the pain worse. Holding still makes the pain better. Denies any paresthesias, weakness, open wound. Patient denies head injury or LOC. She denies any headache, vision changes, back pain, neck pain, lightheadedness, dizziness, chest pain, shortness of breath, nausea, emesis, urinary symptoms, change in bowel habits, paresthesias.  Past Medical History:  Diagnosis Date  . Arthritis   . Cancer ([place])   . History of breast cancer    right breast  . Hypertension   . Thyroid disease     Patient Active Problem List   Diagnosis Date Noted  . History of breast cancer in female 04/01/2012    Past Surgical History:  Procedure Laterality Date  . ABDOMINAL HYSTERECTOMY    . APPENDECTOMY    . CHOLECYSTECTOMY    . MASTECTOMY  06/18/96   right  . TONSILLECTOMY AND ADENOIDECTOMY      OB History    No data available       Home Medications    Prior to Admission medications   Medication Sig Start Date End Date Taking? Authorizing Provider  carvedilol (COREG) 6.25 MG tablet Take 6.25 mg by mouth 2 (two) times daily with a meal.    [provider]  Glucosamine 500 MG CAPS Take 500 mg by mouth 2 (two) times daily.     [provider]    irbesartan-hydrochlorothiazide (AVALIDE) 150-12.5 MG per tablet Take 2 tablets by mouth daily.    [provider]  Multiple Vitamin (MULTIVITAMIN) capsule Take 1 capsule by mouth daily.    [provider]  simvastatin (ZOCOR) 40 MG tablet Take 40 mg by mouth every evening.    [provider]  verapamil (COVERA HS) 240 MG (CO) 24 hr tablet Take 240 mg by mouth at bedtime.    [provider]    Family History Family History  Problem Relation Age of Onset  . Heart disease Father   . Heart disease Sister     Social History Social History  Substance Use Topics  . Smoking status: Never Smoker  . Smokeless tobacco: Not on file  . Alcohol use No     Allergies   Patient has no known allergies.   Review of Systems Review of Systems  Constitutional: Negative for chills and fever.  HENT: Negative for congestion.   Eyes: Negative for visual disturbance.  Respiratory: Negative for cough and shortness of breath.   Cardiovascular: Negative for chest pain.  Gastrointestinal: Negative for abdominal pain, diarrhea, nausea and vomiting.  Musculoskeletal: Positive for arthralgias and joint swelling. Negative for myalgias.  Skin: Negative for rash.  Neurological: Negative for dizziness, syncope, weakness, light-headedness, numbness and headaches.  Psychiatric/Behavioral: Negative for sleep disturbance. The patient is not nervous/anxious.      Physical  Exam Updated Vital Signs BP (!) 169/74   Pulse 76   Temp 97.8 F (36.6 C) (Oral)   Resp 18   Ht 4\' 10"  (1.473 m)   Wt 67.1 kg (148 lb)   SpO2 99%   BMI 30.93 kg/m   Physical Exam  Constitutional: She is oriented to person, place, and time. She appears well-developed and well-nourished.  Non-toxic appearance. No distress.  HENT:  Head: Normocephalic and atraumatic.  Nose: Nose normal.  Mouth/Throat: Oropharynx is clear and moist.  No bilateral hemotympanum. No septal hematoma. No skull  depression.  Eyes: Pupils are equal, round, and reactive to light. Conjunctivae are normal. Right eye exhibits no discharge. Left eye exhibits no discharge.  Neck: Normal range of motion. Neck supple.  No c spine midline tenderness. No paraspinal tenderness. No deformities or step offs noted. Full ROM. Supple. No nuchal rigidity.    Cardiovascular: Normal rate, regular rhythm, normal heart sounds and intact distal pulses.   Pulmonary/Chest: Effort normal and breath sounds normal. No respiratory distress. She exhibits no tenderness.  Abdominal: Soft. Bowel sounds are normal. There is no tenderness. There is no rebound and no guarding.  No echymosis   Musculoskeletal: Normal range of motion. She exhibits no tenderness.  Pelvis is stable. No midline L-spine or T-spine tenderness. No deformities step-offs noted.  Pt moving upper extremities without difficulties.  Shortening and rotation of the left leg is noted. Pain with palpation of the left hip joint. No pain with palpation of the left knee or left ankle. Able to range of motion the left foot fully. Full range of motion of the right leg without any discomfort or pain.  DP pulses are 2+ bilaterally. Sensation intact in all dermatomes. Cap refill is normal.  Lymphadenopathy:    She has no cervical adenopathy.  Neurological: She is alert and oriented to person, place, and time.  Skin: Skin is warm and dry. Capillary refill takes less than 2 seconds.  Psychiatric: Her behavior is normal. Judgment and thought content normal.  Nursing note and vitals reviewed.    ED Treatments / Results  Labs (all labs ordered are listed, but only abnormal results are displayed) Labs Reviewed - No data to display  EKG  EKG Interpretation None       Radiology Dg Hip Unilat With Pelvis 2-3 Views Left  Result Date: 07/30/2017 CLINICAL DATA:  Recent fall with left hip pain EXAM: DG HIP (WITH OR WITHOUT PELVIS) 2-3V LEFT COMPARISON:  Lumbar spine films  of 07/23/2011 FINDINGS: There is an acute angulated left intertrochanteric femoral fracture present with avulsion of the lesser trochanter. No other abnormality is seen. The pelvic rami are intact. Calcified uterine fibroids are noted in the mid pelvis. IMPRESSION: Acute angulated left femoral intertrochanteric fracture. Electronically Signed   By: Ivar Drape M.D.   On: 07/30/2017 15:10    Procedures Procedures (including critical care time)  Medications Ordered in ED Medications  chlorhexidine (HIBICLENS) 4 % liquid 4 application (not administered)  povidone-iodine 10 % swab 2 application (not administered)  ceFAZolin (ANCEF) IVPB 2g/100 mL premix (not administered)  ceFAZolin (ANCEF) 2-4 GM/100ML-% IVPB (not administered)  lactated ringers infusion (not administered)  morphine 4 MG/ML injection 4 mg (4 mg Intravenous Given 07/30/17 1539)  ondansetron (ZOFRAN) injection 4 mg (4 mg Intravenous Given 07/30/17 1539)     Initial Impression / Assessment and Plan / ED Course  I have reviewed the triage vital signs and the nursing notes.  Pertinent labs &  imaging results that were available during my care of the patient were reviewed by me and considered in my medical decision making (see chart for details).     Patient presents to the emergency Department today with complaints of left hip pain after mechanical fall prior to arrival. Patient with left lower extremity shortening and external rotation on exam. Patient is neurovascularly intact in all extremities. Denies head injury or LOC. No focal neuro deficits.  X-ray reveals an acute angulated left femoral intertrochanteric fracture.  Preop screening labs ordered at request of orthopedics. Spoke with Lloyd Huger with orthopedics came to the ED to evaluate patient. Recommends keeping the patient nothing by mouth with hospital admission and surgical intervention this evening.  [facility] with [facility] who agrees to admission  and will come to the ED to evaluate patient. Patient kept nothing by mouth this time. Updated on plan of care. Remains hemodynamically stable at this time.  Final Clinical Impressions(s) / ED Diagnoses   Final diagnoses:  Fall, initial encounter  Closed displaced intertrochanteric fracture of left femur, initial encounter [facility])    New Prescriptions New Prescriptions   No medications on file     Aaron Edelman 07/30/17 1712    Daleen Bo, MD 07/31/17 [phone]

## 2017-07-30 NOTE — Progress Notes (Signed)
Notified MD pts BP after dose of hydralazine was still 190/74.  MD to order additional medications.  Will administer and monitor pt.

## 2017-07-30 NOTE — H&P (Addendum)
History and Physical   Krystal Chambers WUJ:[id number] DOB: 1935/06/29 DOA: 07/30/2017  Referring MD/NP/PA: Jackalyn Lombard EDP PCP: Lawerance Cruel, MD  Patient coming from: Home  Chief Complaint: Left hip pain   HPI: Krystal Chambers is a 82 y.o. female with a history of HTN, HLD, right breast CA, and history of thyroid disorder who presented to the ED after falling at home. She was stepping down a 2-step ladder when she missed the bottom step, falling onto the left side with immediate, abrupt, severe, constant, aching and sharp, nonradiating left hip pain that is worse with movement, improved with morphine given in the ED. Orthopedics was consulted, and plans operative management tonight.   She denies any preceding symptoms prior to fall, history of falling, head trauma, LOC.  Review of Systems: Denies fever, chills, weight loss, changes in vision or hearing, headache, cough, sore throat, chest pain, palpitations, shortness of breath, abdominal pain, nausea, vomiting, changes in bowel habits, blood in stool, change in bladder habits, myalgias, arthralgias, and rash. All others reviewed and are negative.   Diagnosis  . Arthritis  . Cancer ([place])  . History of breast cancer  . Hypertension  . Thyroid disease   No medications for thyroid problem. Has never had a heart attack or work up for cardiac ischemia that she knows of. No complications of any surgery in the past. No adverse reactions to narcotics pain medications. No h/o lung disease.   Past Surgical History:  Procedure Date  . ABDOMINAL HYSTERECTOMY   . APPENDECTOMY   . CHOLECYSTECTOMY   . MASTECTOMY 06/18/96   right  . TONSILLECTOMY AND ADENOIDECTOMY    - Never smoker, no EtOH or illicit drugs. 2 daughter live with her, they have disabilities and she helps them. Independent for all ADLs, performs housework.   - No Known Allergies   Problem Relation  . Heart disease Father  . Heart disease Sister   - Family history otherwise  reviewed and not pertinent.  Prior to Admission medications   Medication Sig  carvedilol (COREG) 6.25 MG tablet Take 6.25 mg by mouth 2 (two) times daily with a meal.  cloNIDine (CATAPRES) 0.1 MG  Take 0.1 mg by mouth 2 (two) times daily.  Glucosamine 500 MG CAPS Take 500 mg by mouth 2 (two) times daily.   Multiple Vitamin (MULTIVITAMIN)  Take 1 capsule by mouth daily.  simvastatin (ZOCOR) 40 MG tablet Take 40 mg by mouth daily.   verapamil (COVERA HS) 240 MG (CO) 24 hr tablet Take 240 mg by mouth 2 (two) times daily.    Physical Exam: BP (!) 141/76   Pulse 70   Temp 97.8 F (36.6 C) (Oral)   Resp 18   Ht 4\' 10"  (1.473 m)   Wt 67.1 kg (148 lb)   SpO2 97%   BMI 30.93 kg/m   Constitutional: 82 y.o. female in no distress, calm demeanor Eyes: Lids and conjunctivae normal, PERRL ENMT: Mucous membranes are moist. Posterior pharynx clear of any exudate or lesions. Fair dentition.  Neck: normal, supple, no masses, no thyromegaly Respiratory: Non-labored breathing room air without accessory muscle use. Clear breath sounds to auscultation bilaterally Cardiovascular: Regular rate and rhythm, no murmurs, rubs, or gallops. No carotid bruits. No JVD. No LE edema. 2+ pedal pulses. Abdomen: Normoactive bowel sounds. No tenderness, non-distended, and no masses palpated. No hepatosplenomegaly. GU: No indwelling catheter Musculoskeletal: No clubbing / cyanosis. LLE held in external rotation, tenderness at the hip with soft compartment. Cap refill  brisk throughout. Sensation intact distally. No knee deformity or tenderness noted. Normal muscle tone.  Skin: Warm, dry. No rashes, wounds, no ulcers. No significant lesions noted.  Neurologic: CN II-XII grossly intact. Gait not assessed. Speech normal. No focal deficits in motor strength or sensation in all extremities.  Psychiatric: Alert and oriented x3. Normal judgment and insight. Mood euthymic with congruent affect.   CBC:  Recent Labs Lab  07/30/17 1648  WBC 9.7  NEUTROABS 7.2  HGB 11.6*  HCT 37.0  MCV 87.5  PLT 286*   Basic Metabolic Panel: No results for input(s): NA, K, CL, CO2, GLUCOSE, BUN, CREATININE, CALCIUM, MG, PHOS in the last 168 hours.  Radiological Exams on Admission: 07/30/2017 DG LEFT HIP Acute angulated left femoral intertrochanteric fracture.   EKG:  - On my interpretation, shows NSR with ventricular rate of 66bpm, LAFB with diffuse wander artifact. ?misplaced leads vs. very abnormal RWP. - Only prior was Sep 2012 with sinus brady and normal RWP, no ischemic changes, suggestive of LVH at that time.   Assessment/Plan Closed left intertrochanteric hip fracture s/p mechanical fall at home - To OR per Dr. Doran Durand.  - Pain control: tylenol, oxyIR, morphine IV prn mild/mod/severe pain. Antiemetic prn and bowel regimen also ordered - DVT ppx per orthopedics. - Activity per orthopedics.  - Will order PT eval for tomorrow. - ECG nonischemic, no chest pain, good functional status. Intermediate risk for intermediate risk surgery due mostly to age. No history of perioperative complications in the past.    Essential HTN: Took medications today. Reports historically well-controlled.  - Restart home medications, coreg, verapamil in AM. Pt reports not taking clonidine for a while and was last filled in 2017. If needed, would restart this medication.   Hyperlipidemia: Chronic, stable.  - Continue statin in AM  Normocytic anemia: Mild, hgb 11.6, unknown chronicity.  - Monitor CBC in AM  Thrombocytopenia: Plt 121, no previous records. No h/o cirrhosis or heart failure.  - Monitor  History of thyroid disorder: No medications at baseline. - Check TSH in AM  History of right breast cancer: s/p mastectomy in 1997 and 5 years of tamoxifen therapy. No evidence of recurrence.   Abnormal BMP: Lab reports an error in the sample received prior to transfer to OR (i.e. K < 2, Cl > 140, Ca < 5).  - Repeat BMP.   DVT  prophylaxis: SCDs, otherwise per orthopedics  Code Status: Full confirmed at admission  Family Communication: Sister at bedside Disposition Plan: Uncertain, based on postoperative course.  Consults called: Orthopedics, Dr. Doran Durand  Admission status: Inpatient    Vance Gather, MD [facility] Pager [phone]  If 7PM-7AM, please contact night-coverage [email or web address] Password [facility] 07/30/2017, 6:13 PM

## 2017-07-30 NOTE — Transfer of Care (Signed)
Immediate Anesthesia Transfer of Care Note  Patient: Krystal Chambers  Procedure(s) Performed: Procedure(s): INTRAMEDULLARY (IM) NAIL INTERTROCHANTRIC (Left)  Patient Location: PACU  Anesthesia Type:General  Level of Consciousness: oriented, drowsy, patient cooperative and responds to stimulation  Airway & Oxygen Therapy: Patient Spontanous Breathing and Patient connected to nasal cannula oxygen  Post-op Assessment: Report given to RN, Post -op Vital signs reviewed and stable and Patient moving all extremities X 4  Post vital signs: Reviewed and stable  Last Vitals:  Vitals:   07/30/17 1615 07/30/17 2105  BP: (!) 141/76   Pulse: 70 76  Resp:  11  Temp:  (!) 36.1 C  SpO2: 97%     Last Pain:  Vitals:   07/30/17 2105  TempSrc:   PainSc: Asleep         Complications: No apparent anesthesia complications

## 2017-07-30 NOTE — Progress Notes (Signed)
Anesthesia MD notified pts BP 206/85.  Meds ordered and will be given ASAP.  Will continue to monitor and notify for further changes.

## 2017-07-30 NOTE — Anesthesia Preprocedure Evaluation (Signed)
Anesthesia Evaluation  Patient identified by MRN, date of birth, ID band Patient awake    Reviewed: Allergy & Precautions, NPO status , Patient's Chart, lab work & pertinent test results, reviewed documented beta blocker date and time   Airway        Dental   Pulmonary neg pulmonary ROS,           Cardiovascular hypertension, Pt. on medications and Pt. on home beta blockers      Neuro/Psych negative neurological ROS     GI/Hepatic negative GI ROS, Neg liver ROS,   Endo/Other  negative endocrine ROS  Renal/GU negative Renal ROS     Musculoskeletal  (+) Arthritis ,   Abdominal   Peds  Hematology negative hematology ROS (+)   Anesthesia Other Findings   Reproductive/Obstetrics                             Lab Results  Component Value Date   HGB 13.6 08/02/2013   HCT 40.0 08/02/2013   Lab Results  Component Value Date   CREATININE 1.30 (H) 08/02/2013   BUN 14 08/02/2013   NA 144 08/02/2013   K 3.5 08/02/2013   CL 104 08/02/2013    Anesthesia Physical Anesthesia Plan  ASA: II  Anesthesia Plan:    Post-op Pain Management:    Induction:   PONV Risk Score and Plan:   Airway Management Planned:   Additional Equipment:   Intra-op Plan:   Post-operative Plan:   Informed Consent:   Plan Discussed with:   Anesthesia Plan Comments:         Anesthesia Quick Evaluation

## 2017-07-30 NOTE — ED Triage Notes (Signed)
Pt from home with ems c.o mechanical fall. Pt fell off step stool injuring her let hip with shortening and rotation noted upon assessment. Pt given 248mcg fentanyl en route. Pain now 8/10. Pt hypertensive upon arrival at 190/80 HR 91. Pt alert and oriented. nad at this time

## 2017-07-30 NOTE — Anesthesia Procedure Notes (Signed)
Procedure Name: Intubation Date/Time: 07/30/2017 7:28 PM Performed by: Claris Che Pre-anesthesia Checklist: Patient identified, Emergency Drugs available, Suction available, Patient being monitored and Timeout performed Patient Re-evaluated:Patient Re-evaluated prior to induction Oxygen Delivery Method: Circle system utilized Preoxygenation: Pre-oxygenation with 100% oxygen Induction Type: IV induction, Rapid sequence and Cricoid Pressure applied Ventilation: Mask ventilation without difficulty Laryngoscope Size: Mac and 4 Grade View: Grade II Tube type: Oral Tube size: 7.5 mm Number of attempts: 1 Airway Equipment and Method: Stylet Placement Confirmation: ETT inserted through vocal cords under direct vision,  positive ETCO2 and breath sounds checked- equal and bilateral Secured at: 22 cm Tube secured with: Tape Dental Injury: Teeth and Oropharynx as per pre-operative assessment

## 2017-07-31 ENCOUNTER — Encounter (HOSPITAL_COMMUNITY): Payer: Self-pay | Admitting: General Practice

## 2017-07-31 DIAGNOSIS — I10 Essential (primary) hypertension: Secondary | ICD-10-CM

## 2017-07-31 DIAGNOSIS — D696 Thrombocytopenia, unspecified: Secondary | ICD-10-CM

## 2017-07-31 DIAGNOSIS — Z853 Personal history of malignant neoplasm of breast: Secondary | ICD-10-CM

## 2017-07-31 DIAGNOSIS — D649 Anemia, unspecified: Secondary | ICD-10-CM

## 2017-07-31 DIAGNOSIS — S72142A Displaced intertrochanteric fracture of left femur, initial encounter for closed fracture: Principal | ICD-10-CM

## 2017-07-31 DIAGNOSIS — E785 Hyperlipidemia, unspecified: Secondary | ICD-10-CM

## 2017-07-31 DIAGNOSIS — Z8639 Personal history of other endocrine, nutritional and metabolic disease: Secondary | ICD-10-CM

## 2017-07-31 LAB — CBC
HEMATOCRIT: 33.8 % — AB (ref 36.0–46.0)
Hemoglobin: 10.8 g/dL — ABNORMAL LOW (ref 12.0–15.0)
MCH: 27.4 pg (ref 26.0–34.0)
MCHC: 31.4 g/dL (ref 30.0–36.0)
MCV: 87.3 fL (ref 78.0–100.0)
Platelets: 239 10*3/uL (ref 150–400)
RBC: 3.87 MIL/uL (ref 3.87–5.11)
RDW: 14.2 % (ref 11.5–15.5)
WBC: 12.9 10*3/uL — ABNORMAL HIGH (ref 4.0–10.5)

## 2017-07-31 LAB — BASIC METABOLIC PANEL
ANION GAP: 9 (ref 5–15)
BUN: 9 mg/dL (ref 6–20)
CHLORIDE: 104 mmol/L (ref 101–111)
CO2: 26 mmol/L (ref 22–32)
Calcium: 9.7 mg/dL (ref 8.9–10.3)
Creatinine, Ser: 1.03 mg/dL — ABNORMAL HIGH (ref 0.44–1.00)
GFR calc Af Amer: 58 mL/min — ABNORMAL LOW (ref >60)
GFR calc non Af Amer: 50 mL/min — ABNORMAL LOW (ref >60)
GLUCOSE: 175 mg/dL — AB (ref 65–99)
POTASSIUM: 4.3 mmol/L (ref 3.5–5.1)
Sodium: 139 mmol/L (ref 135–145)

## 2017-07-31 LAB — TSH: TSH: 1.112 u[IU]/mL (ref 0.350–4.500)

## 2017-07-31 MED ORDER — ENOXAPARIN SODIUM 40 MG/0.4ML ~~LOC~~ SOLN: 40.0000 mg | SUBCUTANEOUS | 0 refills | Status: DC

## 2017-07-31 MED ORDER — TRAMADOL HCL 50 MG PO TABS: 50.0000 mg | ORAL_TABLET | Freq: Four times a day (QID) | ORAL | 0 refills | Status: DC | PRN

## 2017-07-31 MED ORDER — ALUM & MAG HYDROXIDE-SIMETH 200-200-20 MG/5ML PO SUSP
30.0000 mL | Freq: Four times a day (QID) | ORAL | Status: DC | PRN
  Administered 2017-07-31: 30 mL via ORAL
  Filled 2017-07-31: qty 30

## 2017-07-31 MED ORDER — ATORVASTATIN CALCIUM 20 MG PO TABS: 20.0000 mg | ORAL_TABLET | Freq: Every day | ORAL | Status: DC

## 2017-07-31 NOTE — NC FL2 (Signed)
[place] MEDICAID FL2 LEVEL OF CARE SCREENING TOOL     IDENTIFICATION  Patient Name: Krystal Chambers Birthdate: 05/05/1935 Sex: female Admission Date (Current Location): 07/30/2017  [facility] and [place] Number:  Herbalist and Address:  [facility], [place] [address], [place], [place] [address]      Provider Number: [id number]  Attending Physician Name and Address:  Murlean Iba, MD  Relative Name and Phone Number:       Current Level of Care: Hospital Recommended Level of Care: [place] Prior Approval Number:    Date Approved/Denied:   PASRR Number: [id number]  Discharge Plan: SNF    Current Diagnoses: Patient Active Problem List   Diagnosis Date Noted  . Hypertension 07/30/2017  . Closed fracture of femur, intertrochanteric, left, initial encounter ([place]) 07/30/2017  . History of thyroid disease 07/30/2017  . Closed intertrochanteric fracture of hip, left, initial encounter ([place]) 07/30/2017  . Normocytic anemia 07/30/2017  . Hyperlipidemia 07/30/2017  . Thrombocytopenia ([place]) 07/30/2017  . History of breast cancer in female 04/01/2012    Orientation RESPIRATION BLADDER Height & Weight     Self, Time, Situation, Place  Normal Incontinent, Indwelling catheter Weight: 148 lb (67.1 kg) Height:  4\' 10"  (147.3 cm)  BEHAVIORAL SYMPTOMS/MOOD NEUROLOGICAL BOWEL NUTRITION STATUS      Continent Diet (see DC summary)  AMBULATORY STATUS COMMUNICATION OF NEEDS Skin   Limited Assist Verbally Surgical wounds                       Personal Care Assistance Level of Assistance  Bathing, Dressing Bathing Assistance: Limited assistance   Dressing Assistance: Limited assistance     Functional Limitations Info             SPECIAL CARE FACTORS FREQUENCY  PT (By licensed PT), OT (By licensed OT)     PT Frequency: 5/wk OT Frequency: 5/wk            Contractures      Additional Factors Info  Code  Status, Allergies Code Status Info: FULL Allergies Info: NKA           Current Medications (07/31/2017):  This is the current hospital active medication list Current Facility-Administered Medications  Medication Dose Route Frequency Provider Last Rate Last Dose  . 0.9 %  sodium chloride infusion   Intravenous Continuous Corky Sing, [place] 75 mL/hr at 07/31/17 0038    . acetaminophen (TYLENOL) tablet 650 mg  650 mg Oral Q6H PRN Corky Sing, PA-C   650 mg at 07/31/17 0041   Or  . acetaminophen (TYLENOL) suppository 650 mg  650 mg Rectal Q6H PRN Corky Sing, PA-C      . carvedilol (COREG) tablet 6.25 mg  6.25 mg Oral BID WC Patrecia Pour, MD   6.25 mg at 07/31/17 0852  . chlorhexidine (HIBICLENS) 4 % liquid 4 application  60 mL Topical Once Lisette Abu, PA-C      . diphenhydrAMINE (BENADRYL) 12.5 MG/5ML elixir 12.5-25 mg  12.5-25 mg Oral Q4H PRN Corky Sing, PA-C      . docusate sodium (COLACE) capsule 100 mg  100 mg Oral BID Corky Sing, PA-C   100 mg at 07/31/17 6440  . enoxaparin (LOVENOX) injection 40 mg  40 mg Subcutaneous Q24H Ollis, Justin [place], PA-C      . lactated ringers infusion   Intravenous Continuous Rica Koyanagi, MD 10 mL/hr at  07/30/17 1737    . morphine 4 MG/ML injection 1 mg  1 mg Intravenous Q2H PRN Corky Sing, PA-C      . multivitamin with minerals tablet 1 tablet  1 tablet Oral Daily Patrecia Pour, MD   1 tablet at 07/31/17 [phone]  . ondansetron (ZOFRAN) tablet 4 mg  4 mg Oral Q6H PRN Corky Sing, PA-C       Or  . ondansetron [facility]) injection 4 mg  4 mg Intravenous Q6H PRN Corky Sing, PA-C      . oxyCODONE (Oxy IR/ROXICODONE) immediate release tablet 5 mg  5 mg Oral Q4H PRN Patrecia Pour, MD      . povidone-iodine 10 % swab 2 application  2 application Topical Once Lisette Abu, PA-C      . promethazine (PHENERGAN) 25 MG/ML injection           . senna (SENOKOT) tablet 8.6 mg  1 tablet Oral BID Corky Sing, PA-C   8.6 mg at 07/31/17 0850  . simvastatin (ZOCOR) tablet 40 mg  40 mg Oral Daily Patrecia Pour, MD   40 mg at 07/31/17 0855  . sorbitol 70 % solution 30 mL  30 mL Oral Daily PRN Patrecia Pour, MD      . verapamil (CALAN-SR) CR tablet 240 mg  240 mg Oral BID Patrecia Pour, MD   240 mg at 07/31/17 6433     Discharge Medications: Please see discharge summary for a list of discharge medications.  Relevant Imaging Results:  Relevant Lab Results:   Additional Information SS#: [id number]  Jorge Ny, LCSW

## 2017-07-31 NOTE — Clinical Social Work Note (Signed)
Clinical Social Work Assessment  Patient Details  Name: Krystal Chambers MRN: [id number] Date of Birth: Mar 02, 1935  Date of referral:  07/31/17               Reason for consult:                   Permission sought to share information with:  Facility Art therapist granted to share information::  Yes, Verbal Permission Granted  Name::        Agency::  SNF  Relationship::     Contact Information:     Housing/Transportation Living arrangements for the past 2 months:  Single Family Home Source of Information:  Patient Patient Interpreter Needed:  None Criminal Activity/Legal Involvement Pertinent to Current Situation/Hospitalization:  No - Comment as needed Significant Relationships:  Adult Children Lives with:  Self, Adult Children Do you feel safe going back to the place where you live?  No Need for family participation in patient care:  No (Coment)  Care giving concerns:  Pt from home with daughters. Pt was independent prior to placement. Pt unsafe to return home at this time as her new impairment will pose barrier. Pt amenable to SNF.  Social Worker assessment / plan:  CSW met with patient at bedside to discuss clinical team's recommendation for SNF placement at [place] introduced herself and explained her role. CSW discussed SNF options/placement as patient has never experienced SNF before. CSW obtained permission to sent out offers to facilities.  FL2 complete. Passr confirmed. Offers sent.  Employment status:  Retired Nurse, adult PT Recommendations:  [place] / Referral to community resources:  [place]  Patient/Family's Response to care:  Patient amenable to SNF placement. No issues or concerns identified.  Patient/Family's Understanding of and Emotional Response to Diagnosis, Current Treatment, and Prognosis:  Patient has good understanding of diagnosis, current treatment and  prognosis and is hopeful that rehabilitative therapies will address impairment. No issues or concerns identified.  Emotional Assessment Appearance:  Appears stated age Attitude/Demeanor/Rapport:   (Cooperative) Affect (typically observed):  Accepting, Appropriate Orientation:  Oriented to Self, Oriented to Place, Oriented to  Time, Oriented to Situation Alcohol / Substance use:  Not Applicable Psych involvement (Current and /or in the community):  No (Comment)  Discharge Needs  Concerns to be addressed:  Care Coordination Readmission within the last 30 days:  No Current discharge risk:  Dependent with Mobility, Physical Impairment Barriers to Discharge:  No Barriers Identified   Normajean Baxter, LCSW 07/31/2017, 10:30 AM

## 2017-07-31 NOTE — Progress Notes (Signed)
Patient has hx of right breast cancer but there are no restrictions for her arms (as per patient).  Therefore no restriction armband placed on patient.

## 2017-07-31 NOTE — Social Work (Signed)
CSW met with patient again and provided SNF bed offers. Pt indicated that she will discuss bed offers with daughter and f/u with CSW.  CSW will continue to follow.  Elissa Hefty, LCSW Clinical Social Worker [phone]

## 2017-07-31 NOTE — Progress Notes (Addendum)
Subjective: 1 Day Post-Op Procedure(s) (LRB): INTRAMEDULLARY (IM) NAIL INTERTROCHANTRIC (Left)  Patient reports pain as mild to moderate.  Tolerating POs well.  Admits to flatus.  Denies fever, chills, N/V, SOB, CP  Objective:   VITALS:  Temp:  [97 F (36.1 C)-99.5 F (37.5 C)] 99.5 F (37.5 C) (08/29 0527) Pulse Rate:  [70-86] 76 (08/29 0527) Resp:  [11-20] 18 (08/29 0527) BP: (134-217)/(55-85) 134/63 (08/29 0527) SpO2:  [97 %-100 %] 99 % (08/29 0527) Weight:  [67.1 kg (148 lb)] 67.1 kg (148 lb) (08/28 1420)  General: WDWN patient in NAD. Psych:  Appropriate mood and affect. Neuro:  A&O x 3, Moving all extremities, sensation intact to light touch HEENT:  EOMs intact Chest:  Even non-labored respirations Skin:  Dressing C/D/I, no rashes or lesions Extremities: warm/dry, mild edema, no erythema or echymosis.  No lymphadenopathy. Pulses: Femoral 2+ MSK:  ROM: TKE, MMT: patient is able to perform quad set, (-) Homan's    LABS  Recent Labs  07/30/17 1648 07/30/17 1845 07/31/17 0542  HGB 11.6* 13.9 10.8*  WBC 9.7  --  12.9*  PLT 121*  --  239    Recent Labs  07/30/17 1845 07/31/17 0542  NA 142 139  K 4.3 4.3  CL 109 104  CO2  --  26  BUN 11 9  CREATININE 0.90 1.03*  GLUCOSE 128* 175*   No results for input(s): LABPT, INR in the last 72 hours.   Assessment/Plan: 1 Day Post-Op Procedure(s) (LRB): INTRAMEDULLARY (IM) NAIL INTERTROCHANTRIC (Left)  WBAT L LE Up with therapy Lovenox for DVT prophylaxis, script on chart Tramadol for pain upon D/C, script on chart Plan for 2 week outpatient post-op visit with Dr. Doran Durand. Hgb at 7.3 this am.  Patient Asymptomatic.  Will defer to medicine team.   Mechele Claude, [place] Office:  [phone]

## 2017-07-31 NOTE — Progress Notes (Signed)
PROGRESS NOTE    Krystal Chambers  WIO:[id number]  DOB: [birth year]  DOA: 07/30/2017 PCP: Lawerance Cruel, MD   Brief Admission Hx: Krystal Chambers is a 82 y.o. female with a history of HTN, HLD, right breast CA, and history of thyroid disorder who presented to the ED after falling at home. She was stepping down a 2-step ladder when she missed the bottom step, falling onto the left side with immediate, abrupt, severe, constant, aching and sharp, nonradiating left hip pain that is worse with movement, improved with morphine given in the ED. Orthopedics was consulted, and plans operative management.   MDM/Assessment & Plan:   Essential HTN: Took medications today. Reports historically well-controlled.  - Restart home medications, coreg, verapamil in AM. Pt reports not taking clonidine for a while and was last filled in 2017.   Hyperlipidemia: Chronic, stable.  - Continue statin in AM  Normocytic anemia: Mild, hgb 11.6, unknown chronicity.  - Monitor CBC in AM  Thrombocytopenia: Plt 121, no previous records. No h/o cirrhosis or heart failure.  - Monitor  History of thyroid disorder: No medications at baseline. - TSH WNL  History of right breast cancer: s/p mastectomy in 1997 and 5 years of tamoxifen therapy. No evidence of recurrence.    DVT prophylaxis: SCDs, lovenox Code Status: Full confirmed at admission  Family Communication: Sister at bedside Disposition Plan: Uncertain, based on postoperative course.  Consults called: Orthopedics, Dr. Doran Durand  Admission status: Inpatient   Subjective: Pt without complaints, agrees to consider SNF rehab.   Objective: Vitals:   07/30/17 2215 07/30/17 2230 07/30/17 2309 07/31/17 0527  BP: (!) 143/76 (!) 140/57 (!) 145/55 134/63  Pulse: 78 71 73 76  Resp: 17 18 18 18   Temp:   (!) 97.5 F (36.4 C) 99.5 F (37.5 C)  TempSrc:   Oral Oral  SpO2: 99% 100% 100% 99%  Weight:      Height:        Intake/Output Summary (Last 24  hours) at 07/31/17 1249 Last data filed at 07/31/17 0700  Gross per 24 hour  Intake           1577.5 ml  Output              925 ml  Net            652.5 ml   Filed Weights   07/30/17 1420  Weight: 67.1 kg (148 lb)     REVIEW OF SYSTEMS  As per history otherwise all reviewed and reported negative  Exam:  General exam: awake, alert, NAD Respiratory system: Clear. No increased work of breathing. Cardiovascular system: S1 & S2 heard. No JVD, murmurs, gallops, clicks or pedal edema. Gastrointestinal system: Abdomen is nondistended, soft and nontender. Normal bowel sounds heard. Central nervous system: Alert and oriented. No focal neurological deficits. Extremities: wound clean dry intact.   Data Reviewed: Basic Metabolic Panel:  Recent Labs Lab 07/30/17 1845 07/31/17 0542  NA 142 139  K 4.3 4.3  CL 109 104  CO2  --  26  GLUCOSE 128* 175*  BUN 11 9  CREATININE 0.90 1.03*  CALCIUM  --  9.7   Liver Function Tests: No results for input(s): AST, ALT, ALKPHOS, BILITOT, PROT, ALBUMIN in the last 168 hours. No results for input(s): LIPASE, AMYLASE in the last 168 hours. No results for input(s): AMMONIA in the last 168 hours. CBC:  Recent Labs Lab 07/30/17 1648 07/30/17 1845 07/31/17 0542  WBC 9.7  --  12.9*  NEUTROABS 7.2  --   --   HGB 11.6* 13.9 10.8*  HCT 37.0 41.0 33.8*  MCV 87.5  --  87.3  PLT 121*  --  239   Cardiac Enzymes: No results for input(s): CKTOTAL, CKMB, CKMBINDEX, TROPONINI in the last 168 hours. CBG (last 3)  No results for input(s): GLUCAP in the last 72 hours. No results found for this or any previous visit (from the past 240 hour(s)).   Studies: Dg C-arm 1-60 Min  Result Date: 07/30/2017 CLINICAL DATA:  Left femur fracture fixation EXAM: LEFT FEMUR 2 VIEWS; DG C-ARM 61-120 MIN COMPARISON:  07/30/2017 FINDINGS: Four low resolution intraoperative spot views of the left femur are submitted for interpretation. Total fluoroscopy time was 1  minutes 13.8 seconds. Images were obtained during intramedullary rod and distal screw fixation of the left femur for an intertrochanteric fracture IMPRESSION: Intraoperative fluoroscopic assistance provided during surgical fixation of left intertrochanteric fracture Electronically Signed   By: Donavan Foil M.D.   On: 07/30/2017 21:54   Dg Hip Unilat With Pelvis 2-3 Views Left  Result Date: 07/30/2017 CLINICAL DATA:  Recent fall with left hip pain EXAM: DG HIP (WITH OR WITHOUT PELVIS) 2-3V LEFT COMPARISON:  Lumbar spine films of 07/23/2011 FINDINGS: There is an acute angulated left intertrochanteric femoral fracture present with avulsion of the lesser trochanter. No other abnormality is seen. The pelvic rami are intact. Calcified uterine fibroids are noted in the mid pelvis. IMPRESSION: Acute angulated left femoral intertrochanteric fracture. Electronically Signed   By: Ivar Drape M.D.   On: 07/30/2017 15:10   Dg Femur Min 2 Views Left  Result Date: 07/30/2017 CLINICAL DATA:  Left femur fracture fixation EXAM: LEFT FEMUR 2 VIEWS; DG C-ARM 61-120 MIN COMPARISON:  07/30/2017 FINDINGS: Four low resolution intraoperative spot views of the left femur are submitted for interpretation. Total fluoroscopy time was 1 minutes 13.8 seconds. Images were obtained during intramedullary rod and distal screw fixation of the left femur for an intertrochanteric fracture IMPRESSION: Intraoperative fluoroscopic assistance provided during surgical fixation of left intertrochanteric fracture Electronically Signed   By: Donavan Foil M.D.   On: 07/30/2017 21:54   Scheduled Meds: . carvedilol  6.25 mg Oral BID WC  . chlorhexidine  60 mL Topical Once  . docusate sodium  100 mg Oral BID  . enoxaparin (LOVENOX) injection  40 mg Subcutaneous Q24H  . multivitamin with minerals  1 tablet Oral Daily  . povidone-iodine  2 application Topical Once  . senna  1 tablet Oral BID  . simvastatin  40 mg Oral Daily  . verapamil  240 mg  Oral BID   Continuous Infusions: . sodium chloride 75 mL/hr at 07/31/17 0038  . lactated ringers 10 mL/hr at 07/30/17 1737    Principal Problem:   Closed fracture of femur, intertrochanteric, left, initial encounter [facility]) Active Problems:   History of breast cancer in female   Hypertension   History of thyroid disease   Closed intertrochanteric fracture of hip, left, initial encounter ([place])   Normocytic anemia   Hyperlipidemia   Thrombocytopenia ([place])   Time spent:   Irwin Brakeman, MD, FAAFP [facility] Pager [phone]  If 7PM-7AM, please contact night-coverage [email or web address] Password [email or web address] 07/31/2017, 12:49 PM    LOS: 1 day

## 2017-07-31 NOTE — Evaluation (Signed)
Physical Therapy Evaluation Patient Details Name: Krystal Chambers MRN: [id number] DOB: [birth year] Today's Date: 07/31/2017   History of Present Illness  82 y.o. femalewith a history of HTN, HLD, right breast CA, and history of thyroid disorder who presented to the ED after falling at home. She was stepping down a 2-step ladder when she missed the bottom step, falling onto the left side with immediate, abrupt, severe, constant, aching and sharp, nonradiating left hip pain that is worse with movement, improved with morphine given in the ED. Open treatment of left hip intertrochanteric fracture with intramedullary nailing on 07/30/17; LLE WBAT.   Clinical Impression  Pt s/p surgery above with deficits below. PTA, pt was independent with functional mobility. Upon eval, pt limited by pain, weakness, and decreased balance. Only tolerated very short distance to [facility] this session and required min A for mobility. Pt reports she discussed SNF with OT this morning and feel this is appropriate given current limitations to increase independence and safety with functional mobility. Will continue to follow acutely.     Follow Up Recommendations SNF;Supervision/Assistance - 24 hour    Equipment Recommendations  Rolling walker with 5" wheels    Recommendations for Other Services       Precautions / Restrictions Precautions Precautions: Fall Precaution Comments: HAd fall at home  Restrictions Weight Bearing Restrictions: Yes LLE Weight Bearing: Weight bearing as tolerated      Mobility  Bed Mobility               General bed mobility comments: In chair upon entry   Transfers Overall transfer level: Needs assistance Equipment used: Rolling walker (2 wheeled) Transfers: Sit to/from [facility] Sit to Stand: Min assist Stand pivot transfers: Min assist       General transfer comment: Min A to power up into standing and steady during turning to recliner from [facility]. Min VCs for  hand placement  Ambulation/Gait Ambulation/Gait assistance: Min assist Ambulation Distance (Feet): 1 Feet Assistive device: Rolling walker (2 wheeled) Gait Pattern/deviations: Step-to pattern;Shuffle;Decreased step length - right;Decreased step length - left;Decreased weight shift to left;Antalgic Gait velocity: Decreased Gait velocity interpretation: Below normal speed for age/gender General Gait Details: Able to take a few steps to [facility]. Very limited tolerance secondary to pain. Verbal cues for sequencing with RW and for increasing step height on LLE.   Stairs            Wheelchair Mobility    Modified Rankin (Stroke Patients Only)       Balance Overall balance assessment: Needs assistance Sitting-balance support: No upper extremity supported;Feet supported Sitting balance-Leahy Scale: Fair     Standing balance support: Bilateral upper extremity supported;During functional activity Standing balance-Leahy Scale: Poor Standing balance comment: Reliant on UE support                             Pertinent Vitals/Pain Pain Assessment: Faces Faces Pain Scale: Hurts even more Pain Location: L hip Pain Descriptors / Indicators: Discomfort;Grimacing;Constant Pain Intervention(s): Limited activity within patient's tolerance;Monitored during session;Repositioned;Patient requesting pain meds-RN notified    Home Living Family/patient expects to be discharged to:: Skilled nursing facility Living Arrangements: Children   Type of Home: House Home Access: Stairs to enter   [employer] of Steps: 2 Home Layout: One level        Prior Function Level of Independence: Independent         Comments: ADLs, IADLs, driving, and  primary caregiver for her two daughters     Hand Dominance   Dominant Hand: Right    Extremity/Trunk Assessment   Upper Extremity Assessment Upper Extremity Assessment: Defer to OT evaluation    Lower Extremity  Assessment Lower Extremity Assessment: LLE deficits/detail LLE Deficits / Details: Deficits consistent with post op pain and weakness. Difficulty with hip extension during gait.     Cervical / Trunk Assessment Cervical / Trunk Assessment: Kyphotic  Communication   Communication: No difficulties  Cognition Arousal/Alertness: Awake/alert Behavior During Therapy: WFL for tasks assessed/performed Overall Cognitive Status: Within Functional Limits for tasks assessed                                        General Comments General comments (skin integrity, edema, etc.): Pt's daughter present at beginning of session.     Exercises     Assessment/Plan    PT Assessment Patient needs continued PT services  PT Problem List Decreased range of motion;Decreased strength;Decreased activity tolerance;Decreased balance;Decreased mobility;Decreased knowledge of use of DME;Decreased knowledge of precautions;Pain       PT Treatment Interventions DME instruction;Gait training;Functional mobility training;Therapeutic activities;Therapeutic exercise;Balance training;Neuromuscular re-education;Patient/family education    PT Goals (Current goals can be found in the Care Plan section)  Acute Rehab PT Goals Patient Stated Goal: Return to PLOF PT Goal Formulation: With patient Time For Goal Achievement: 08/07/17 Potential to Achieve Goals: Good    Frequency Min 3X/week   Barriers to discharge        Co-evaluation               AM-PAC PT "6 Clicks" Daily Activity  Outcome Measure Difficulty turning over in bed (including adjusting bedclothes, sheets and blankets)?: A Lot Difficulty moving from lying on back to sitting on the side of the bed? : A Lot Difficulty sitting down on and standing up from a chair with arms (e.g., wheelchair, bedside commode, etc,.)?: Unable Help needed moving to and from a bed to chair (including a wheelchair)?: A Little Help needed walking in  hospital room?: A Little Help needed climbing 3-5 steps with a railing? : A Lot 6 Click Score: 13    End of Session Equipment Utilized During Treatment: Gait belt Activity Tolerance: Patient limited by pain Patient left: in chair;with call bell/phone within reach Nurse Communication: Mobility status;Patient requests pain meds PT Visit Diagnosis: Other abnormalities of gait and mobility (R26.89);Pain;Muscle weakness (generalized) (M62.81);History of falling (Z91.81) Pain - Right/Left: Left Pain - part of body: Hip    Time: 4540-9811 PT Time Calculation (min) (ACUTE ONLY): 17 min   Charges:   PT Evaluation $PT Eval Moderate Complexity: 1 Mod     PT G Codes:        Leighton Ruff, PT, DPT  Acute Rehabilitation Services  Pager: [phone]   Rudean Hitt 07/31/2017, 2:20 PM

## 2017-07-31 NOTE — Evaluation (Signed)
Occupational Therapy Evaluation Patient Details Name: Krystal Chambers MRN: [id number] DOB: Jul 20, 1935 Today's Date: 07/31/2017    History of Present Illness 82 y.o. femalewith a history of HTN, HLD, right breast CA, and history of thyroid disorder who presented to the ED after falling at home. She was stepping down a 2-step ladder when she missed the bottom step, falling onto the left side with immediate, abrupt, severe, constant, aching and sharp, nonradiating left hip pain that is worse with movement, improved with morphine given in the ED. Open treatment of left hip intertrochanteric fracture with intramedullary nailing on 07/30/17; LLE WBAT.    Clinical Impression   PTA, pt was independent and living with her two daughters whom she was acting as primary caregiver. Pt highly motivated to return to PLOF and participate in therapy. Currently, pt requiring Min A for LB ADLs and functional mobility with RW. Pt would benefit form further acute OT to facilitate safe dc. Recommend dc to SNF for further OT to optimize safety and independence with ADLs and functional mobility.      Follow Up Recommendations  Supervision/Assistance - 24 hour ; SNF   Equipment Recommendations  Other (comment) (Defer to next venue)    Recommendations for Other Services PT consult     Precautions / Restrictions Precautions Precautions: Fall Restrictions Weight Bearing Restrictions: Yes LLE Weight Bearing: Weight bearing as tolerated      Mobility Bed Mobility Overal bed mobility: Needs Assistance Bed Mobility: Supine to Sit     Supine to sit: Min assist;HOB elevated     General bed mobility comments: Used bed pad to bring hips towards EOB. Pt utilized elevated HOB and bedrails.  Transfers Overall transfer level: Needs assistance Equipment used: Rolling walker (2 wheeled) Transfers: Sit to/from [facility] Sit to Stand: Min assist Stand pivot transfers: Min assist       General  transfer comment: Min A to power up into standing and steady during turning to recliner. Min VCs for hand placement    Balance Overall balance assessment: Needs assistance Sitting-balance support: No upper extremity supported;Feet supported Sitting balance-Leahy Scale: Fair     Standing balance support: Bilateral upper extremity supported;During functional activity Standing balance-Leahy Scale: Poor Standing balance comment: Reliant on UE support                           ADL either performed or assessed with clinical judgement   ADL Overall ADL's : Needs assistance/impaired Eating/Feeding: Set up;Sitting   Grooming: Brushing hair;Set up;Supervision/safety;Sitting Grooming Details (indicate cue type and reason): Performed grooming at EOB Upper Body Bathing: Set up;Sitting   Lower Body Bathing: Minimal assistance;Sit to/from stand   Upper Body Dressing : Set up;Sitting   Lower Body Dressing: Minimal assistance;Sit to/from stand   Toilet Transfer: Minimal assistance;Stand-pivot;RW (Simulated to recliner) Toilet Transfer Details (indicate cue type and reason): Pt requiring Min A to power up into standing from EOB. Min A to stabilize while taking a few steps to turn towards recliner. Feel pt will progress well.          Functional mobility during ADLs: Minimal assistance;Rolling walker (Stand pivot to recliner) General ADL Comments: Pt requiring Min A for LB ADLs and fucntional transfers. Pt showing good rehab potential and feel she will progress well with time. Pt demonstrating motivation to return to PLOF and good activity tolerance despite pain.     Vision         Perception  Praxis      Pertinent Vitals/Pain Pain Assessment: Faces Pain Score:  ("Somewhere between 5 and 10 when I stand") Faces Pain Scale: Hurts even more Pain Descriptors / Indicators: Discomfort;Grimacing;Constant Pain Intervention(s): Monitored during session;Limited activity within  patient's tolerance;Repositioned     Hand Dominance Right   Extremity/Trunk Assessment Upper Extremity Assessment Upper Extremity Assessment: Overall WFL for tasks assessed   Lower Extremity Assessment Lower Extremity Assessment: Defer to PT evaluation   Cervical / Trunk Assessment Cervical / Trunk Assessment: Kyphotic   Communication Communication Communication: No difficulties   Cognition Arousal/Alertness: Awake/alert Behavior During Therapy: WFL for tasks assessed/performed Overall Cognitive Status: Within Functional Limits for tasks assessed                                     General Comments  Pt SpO2 dropping to mid 80s - pt requiring Min VCs to breath during activity.     Exercises     Shoulder Instructions      Home Living Family/patient expects to be discharged to:: Skilled nursing facility Living Arrangements: Children (Two daughters)   Type of Home: House Home Access: Stairs to enter Technical brewer of Steps: 2   Home Layout: One level                          Prior Functioning/Environment Level of Independence: Independent        Comments: ADLs, IADLs, driving, and primary caregiver for her two daughters        OT Problem List: Decreased range of motion;Impaired balance (sitting and/or standing);Decreased safety awareness;Decreased knowledge of use of DME or AE;Decreased knowledge of precautions;Pain      OT Treatment/Interventions: Self-care/ADL training;Therapeutic exercise;Energy conservation;DME and/or AE instruction;Therapeutic activities;Patient/family education    OT Goals(Current goals can be found in the care plan section) Acute Rehab OT Goals Patient Stated Goal: Return to PLOF OT Goal Formulation: With patient Time For Goal Achievement: 08/14/17 Potential to Achieve Goals: Good ADL Goals Pt Will Perform Grooming: with supervision;with set-up;standing Pt Will Perform Lower Body Dressing: with min  guard assist;sit to/from stand Pt Will Transfer to Toilet: with min guard assist;ambulating;bedside commode Pt Will Perform Toileting - Clothing Manipulation and hygiene: with min guard assist;sit to/from stand  OT Frequency: Min 2X/week   Barriers to D/C:            Co-evaluation              AM-PAC PT "6 Clicks" Daily Activity     Outcome Measure Help from another person eating meals?: None Help from another person taking care of personal grooming?: A Little Help from another person toileting, which includes using toliet, bedpan, or urinal?: A Little Help from another person bathing (including washing, rinsing, drying)?: A Little Help from another person to put on and taking off regular upper body clothing?: None Help from another person to put on and taking off regular lower body clothing?: A Little 6 Click Score: 20   End of Session Equipment Utilized During Treatment: Gait belt;Rolling walker Nurse Communication: Mobility status;Precautions;Weight bearing status  Activity Tolerance: Patient tolerated treatment well Patient left: in chair;with call bell/phone within reach;with family/visitor present  OT Visit Diagnosis: Unsteadiness on feet (R26.81);Other abnormalities of gait and mobility (R26.89);Muscle weakness (generalized) (M62.81);Pain;History of falling (Z91.81) Pain - Right/Left: Left Pain - part of body: Leg;Hip  Time: 1314-3888 OT Time Calculation (min): 27 min Charges:  OT General Charges $OT Visit: 1 Visit OT Evaluation $OT Eval Low Complexity: 1 Low OT Treatments $Self Care/Home Management : 8-22 mins G-Codes:     Jaklyn Alen MSOT, OTR/L Acute Rehab Pager: [phone] Office: [place] 07/31/2017, 9:14 AM

## 2017-08-01 ENCOUNTER — Encounter (HOSPITAL_COMMUNITY): Payer: Self-pay | Admitting: Family Medicine

## 2017-08-01 MED ORDER — ACETAMINOPHEN 325 MG PO TABS: 650.0000 mg | ORAL_TABLET | Freq: Four times a day (QID) | ORAL | Status: AC | PRN

## 2017-08-01 MED ORDER — DOCUSATE SODIUM 100 MG PO CAPS: 100.0000 mg | ORAL_CAPSULE | Freq: Two times a day (BID) | ORAL | 0 refills | Status: DC

## 2017-08-01 MED ORDER — ATORVASTATIN CALCIUM 20 MG PO TABS: 20.0000 mg | ORAL_TABLET | Freq: Every day | ORAL | Status: DC

## 2017-08-01 NOTE — Social Work (Signed)
CSW met with patient this day and she has selected [facility] for placement.  CSW confirmed acceptance of bed with [facility] in admissions.  CSW spoke with doctor who indicated that he will look to DC patient today.  Elissa Hefty, LCSW Clinical Social Worker [phone]

## 2017-08-01 NOTE — Clinical Social Work Placement (Signed)
   CLINICAL SOCIAL WORK PLACEMENT  NOTE  Date:  08/01/2017  Patient Details  Name: Krystal Chambers MRN: [id number] Date of Birth: 1934/12/08  Clinical Social Work is seeking post-discharge placement for this patient at the [place] level of care (*CSW will initial, date and re-position this form in  chart as items are completed):  Yes   Patient/family provided with [place] Work Department's list of facilities offering this level of care within the geographic area requested by the patient (or if unable, by the patient's family).  Yes   Patient/family informed of their freedom to choose among providers that offer the needed level of care, that participate in Medicare, Medicaid or managed care program needed by the patient, have an available bed and are willing to accept the patient.  Yes   Patient/family informed of [facility]'s ownership interest in [facility] and [facility], as well as of the fact that they are under no obligation to receive care at these facilities.  PASRR submitted to EDS on       PASRR number received on       Existing PASRR number confirmed on 08/01/17     FL2 transmitted to all facilities in geographic area requested by pt/family on       FL2 transmitted to all facilities within larger geographic area on 08/01/17     Patient informed that his/her managed care company has contracts with or will negotiate with certain facilities, including the following:        Yes   Patient/family informed of bed offers received.  Patient chooses bed at [facility]     Physician recommends and patient chooses bed at      Patient to be transferred to [facility] on 08/01/17.  Patient to be transferred to facility by [facility]     Patient family notified on 08/01/17 of transfer.  Name of family member notified:  patient responsible for self and daughter advised     PHYSICIAN Please prepare priority discharge  summary, including medications, Please sign FL2, Please prepare prescriptions     Additional Comment:    _______________________________________________ Normajean Baxter, LCSW 08/01/2017, 10:00 AM

## 2017-08-01 NOTE — Progress Notes (Signed)
Physical Therapy Treatment Patient Details Name: Krystal Chambers MRN: [id number] DOB: July 13, 1935 Today's Date: 08/01/2017    History of Present Illness 82 y.o. femalewith a history of HTN, HLD, right breast CA, and history of thyroid disorder who presented to the ED after falling at home. She was stepping down a 2-step ladder when she missed the bottom step, falling onto the left side with immediate, abrupt, severe, constant, aching and sharp, nonradiating left hip pain that is worse with movement, improved with morphine given in the ED. Open treatment of left hip intertrochanteric fracture with intramedullary nailing on 07/30/17; LLE WBAT.     PT Comments    Pt in chair on arrival. She reports she has started walking to the bathroom with assist from nursing. Today's skilled session focused on progressing ambulation. Pt ambulated 20 ft in room from recliner to door. Pt's ambulation distance currently limited by pain. Pt is very reliant on RW for support. Continues to be appropriate for SNF and would benefit from continued skilled PT to increase functional independence and return to PLOF. Will continue to follow acutely.    Follow Up Recommendations  SNF;Supervision/Assistance - 24 hour     Equipment Recommendations  Rolling walker with 5" wheels    Recommendations for Other Services       Precautions / Restrictions Precautions Precautions: Fall Precaution Comments: HAd fall at home  Restrictions Weight Bearing Restrictions: Yes LLE Weight Bearing: Weight bearing as tolerated    Mobility  Bed Mobility               General bed mobility comments: In chair upon entry   Transfers Overall transfer level: Needs assistance Equipment used: Rolling walker (2 wheeled) Transfers: Sit to/from Stand Sit to Stand: Min assist         General transfer comment: min A to power up into standing, no assist required for stand>sit. Curing for hand placement and  technique  Ambulation/Gait Ambulation/Gait assistance: Min guard Ambulation Distance (Feet): 20 Feet Assistive device: Rolling walker (2 wheeled) Gait Pattern/deviations: Step-to pattern;Shuffle;Decreased step length - right;Decreased step length - left;Decreased weight shift to left;Antalgic Gait velocity: Decreased Gait velocity interpretation: Below normal speed for age/gender General Gait Details: Pt ambulated from recliner to entry door with min guard for safety. Pt with slow cautious gait. She hesitates to place weight on the LLE. Pt ambulation distance limited by pain.   Stairs            Wheelchair Mobility    Modified Rankin (Stroke Patients Only)       Balance Overall balance assessment: Needs assistance Sitting-balance support: No upper extremity supported;Feet supported Sitting balance-Leahy Scale: Fair     Standing balance support: Bilateral upper extremity supported;During functional activity Standing balance-Leahy Scale: Poor Standing balance comment: Reliant on UE support                            Cognition Arousal/Alertness: Awake/alert Behavior During Therapy: WFL for tasks assessed/performed Overall Cognitive Status: Within Functional Limits for tasks assessed                                        Exercises General Exercises - Lower Extremity Ankle Circles/Pumps: AROM;Both;10 reps;Seated Long Arc Quad: AROM;Left;10 reps;Seated Heel Slides: AAROM;Left;5 reps;Seated Hip ABduction/ADduction: AAROM;Left;5 reps;Seated    General Comments  Pertinent Vitals/Pain Pain Assessment: 0-10 Pain Score: 5  (with ambulation) Pain Location: L hip Pain Descriptors / Indicators: Discomfort;Grimacing;Constant Pain Intervention(s): Monitored during session;Limited activity within patient's tolerance    Home Living                      Prior Function            PT Goals (current goals can now be found in the  care plan section) Acute Rehab PT Goals Patient Stated Goal: Return to PLOF PT Goal Formulation: With patient Time For Goal Achievement: 08/07/17 Potential to Achieve Goals: Good Progress towards PT goals: Progressing toward goals    Frequency    Min 3X/week      PT Plan Current plan remains appropriate    Co-evaluation              AM-PAC PT "6 Clicks" Daily Activity  Outcome Measure  Difficulty turning over in bed (including adjusting bedclothes, sheets and blankets)?: A Lot Difficulty moving from lying on back to sitting on the side of the bed? : A Lot Difficulty sitting down on and standing up from a chair with arms (e.g., wheelchair, bedside commode, etc,.)?: Unable Help needed moving to and from a bed to chair (including a wheelchair)?: A Little Help needed walking in hospital room?: A Little Help needed climbing 3-5 steps with a railing? : A Lot 6 Click Score: 13    End of Session Equipment Utilized During Treatment: Gait belt Activity Tolerance: Patient limited by pain;Patient tolerated treatment well Patient left: in chair;with call bell/phone within reach Nurse Communication: Mobility status PT Visit Diagnosis: Other abnormalities of gait and mobility (R26.89);Pain;Muscle weakness (generalized) (M62.81);History of falling (Z91.81) Pain - Right/Left: Left Pain - part of body: Hip     Time: 7482-7078 PT Time Calculation (min) (ACUTE ONLY): 18 min  Charges:  $Gait Training: 8-22 mins                    G Codes:       Benjiman Core, [place] Pager [phone] Acute Rehab   Allena Katz 08/01/2017, 11:59 AM

## 2017-08-01 NOTE — Progress Notes (Signed)
Pt discharged to [facility] SNF in stable condition, all discharge instructions reviewed with patient and Rx's given to [facility], [email or web address]

## 2017-08-01 NOTE — Social Work (Addendum)
SNF is not able to verify her insurance information. Patient daughter is trying to provide that information to SNF.   CSW cancelled transportation at this time based on same.  CSW was advised by SNF that insurance has been verified. Pt is cleared to DC to [facility].  Elissa Hefty, LCSW Clinical Social Worker [phone]

## 2017-08-01 NOTE — Social Work (Signed)
Clinical Social Worker facilitated patient discharge including contacting patient family and facility to confirm patient discharge plans.  Clinical information faxed to facility and family agreeable with plan.    CSW arranged ambulance transport via [facility] to [facility].    RN to call [phone] to give report prior to discharge.   Clinical Social Worker will sign off for now as social work intervention is no longer needed. Please consult us again if new need arises.  Elam Ellis, LCSW Clinical Social Worker [phone]    

## 2017-08-01 NOTE — Progress Notes (Signed)
Subjective: 2 Days Post-Op Procedure(s) (LRB): INTRAMEDULLARY (IM) NAIL INTERTROCHANTRIC (Left)  Patient reports pain as mild to moderate.  Tolerating POs well.  Admits to flatus.  Reports that she has worked well with therapy and ready for D/C.  Objective:   VITALS:  Temp:  [98.3 F (36.8 C)-98.9 F (37.2 C)] 98.3 F (36.8 C) (08/30 0838) Pulse Rate:  [56-74] 69 (08/30 0958) Resp:  [14-18] 18 (08/30 0838) BP: (107-145)/(44-68) 123/48 (08/30 0958) SpO2:  [93 %-97 %] 97 % (08/30 0838)  General: WDWN patient in NAD. Psych:  Appropriate mood and affect. Neuro:  A&O x 3, Moving all extremities, sensation intact to light touch HEENT:  EOMs intact Chest:  Even non-labored respirations Skin:  Incision C/D/I, no rashes or lesions Extremities: warm/dry, mild edema, no erythema or echymosis.  No lymphadenopathy. Pulses: Popliteus 2+ MSK:  ROM: TKE, MMT: patient is able to perform quad set, (-) Homan's    LABS  Recent Labs  07/30/17 1648 07/30/17 1845 07/31/17 0542  HGB 11.6* 13.9 10.8*  WBC 9.7  --  12.9*  PLT 121*  --  239    Recent Labs  07/30/17 1845 07/31/17 0542  NA 142 139  K 4.3 4.3  CL 109 104  CO2  --  26  BUN 11 9  CREATININE 0.90 1.03*  GLUCOSE 128* 175*   No results for input(s): LABPT, INR in the last 72 hours.   Assessment/Plan: 2 Days Post-Op Procedure(s) (LRB): INTRAMEDULLARY (IM) NAIL INTERTROCHANTRIC (Left)  WBAT L LE Up with therapy Lovenox for DVT prophylaxis Disposition: SNF Plan for 2 week outpatient post-op visit with Dr. Doran Durand.  Mechele Claude, PA-C [facility] Office:  [phone]

## 2017-08-01 NOTE — Discharge Summary (Signed)
Physician Discharge Summary  THERESEA Chambers RWE:[id number] DOB: 06-17-1935 DOA: 07/30/2017  PCP: Lawerance Cruel, MD Orthopedics: Dr. Doran Durand  Admit date: 07/30/2017 Discharge date: 08/01/2017  Admitted From: Home  Disposition: SNF   Recommendations for Outpatient Follow-up:  1. Follow up with PCP in 2 weeks 2. Follow up with orthopedics in 2 weeks  3. Please obtain BMP/CBC in 3-5 days 4. Fall precautions. 5. Continue PT/OT  6. Rolling walker with 5 inch wheels recommended for safety  Discharge Condition: STABLE   CODE STATUS: FULL    Brief Hospitalization Summary: Please see all hospital notes, images, labs for full details of [facility] hospitalization. HPI: [facility] Chambers is a 82 y.o. female with a history of HTN, HLD, right breast CA, and history of thyroid disorder who presented to [facility] ED after falling at home. She was stepping down a 2-step ladder when she missed [facility] bottom step, falling onto [facility] left side with immediate, abrupt, severe, constant, aching and sharp, nonradiating left hip pain that is worse with movement, improved with morphine given in [facility] ED. Orthopedics was consulted, and plans operative management tonight.   She denies any preceding symptoms prior to fall, history of falling, head trauma, LOC.  MDM/Assessment & Plan: Postop s/p ORIF  INTRAMEDULLARY (IM) NAIL INTERTROCHANTRIC (Left) - per ortho Lovenox ordered for DVT prophylaxis for 2 weeks post surgery.  Follow up in 2 weeks with Dr. Doran Durand.   Essential HTN: Took medications today. Reports historically well-controlled.  - Restart home medications, coreg, verapamil. Pt reports not taking clonidine for a while and was last filled in 2017.   Hyperlipidemia: Chronic, stable.  - Continue statin, pharmacist switched to atorvastatin 20 mg daily for safety, DC simvastatin  Normocytic anemia: Mild, hgb 11.6, unknown chronicity.  - Monitor CBC   Thrombocytopenia: Plt 121, no previous records. No h/o cirrhosis or  heart failure.  - Resolved.  History of thyroid disorder: No medications at baseline. - TSH WNL  History of right breast cancer: s/p mastectomy in 1997 and 5 years of tamoxifen therapy. No evidence of recurrence.   Leukocytosis - post op bump in WBC, recheck CBC in 3-5 days, no s/s of infection found.  DVT prophylaxis:SCDs, lovenox Code Status:Full confirmed at admission Family Communication:Sister at bedside Disposition Plan:Uncertain, based on postoperative course.  Consults called:Orthopedics, Dr. Doran Durand Admission status:Inpatient   Discharge Diagnoses:  Principal Problem:   Closed fracture of femur, intertrochanteric, left, initial encounter [facility]) Active Problems:   History of breast cancer in female   Hypertension   History of thyroid disease   Closed intertrochanteric fracture of hip, left, initial encounter ([place])   Normocytic anemia   Hyperlipidemia   Thrombocytopenia ([place])  Discharge Instructions: Discharge Instructions    Call MD / Call 911    Complete by:  As directed    If you experience chest pain or shortness of breath, CALL 911 and be transported to [facility] hospital emergency room.  If you develope a fever above 101 F, pus (white drainage) or increased drainage or redness at [facility] wound, or calf pain, call your surgeon's office.   Call MD for:  difficulty breathing, headache or visual disturbances    Complete by:  As directed    Call MD for:  extreme fatigue    Complete by:  As directed    Call MD for:  persistant dizziness or light-headedness    Complete by:  As directed    Call MD for:  persistant nausea and vomiting  Complete by:  As directed    Call MD for:  redness, tenderness, or signs of infection (pain, swelling, redness, odor or green/yellow discharge around incision site)    Complete by:  As directed    Call MD for:  severe uncontrolled pain    Complete by:  As directed    Constipation Prevention    Complete by:  As directed    Drink  plenty of fluids.  Prune juice may be helpful.  You may use a stool softener, such as Colace (over [facility] counter) 100 mg twice a day.  Use MiraLax (over [facility] counter) for constipation as needed.   Diet - low sodium heart healthy    Complete by:  As directed    Increase activity slowly    Complete by:  As directed    Increase activity slowly as tolerated    Complete by:  As directed    Weight bearing as tolerated    Complete by:  As directed    Laterality:  left   Extremity:  Lower     Allergies as of 08/01/2017   No Known Allergies     Medication List    STOP taking these medications   cloNIDine 0.1 MG tablet Commonly known as:  CATAPRES   Glucosamine 500 MG Caps   simvastatin 40 MG tablet Commonly known as:  ZOCOR     TAKE these medications   acetaminophen 325 MG tablet Commonly known as:  TYLENOL Take 2 tablets (650 mg total) by mouth every 6 (six) hours as needed for mild pain (or Fever >/= 101).   atorvastatin 20 MG tablet Commonly known as:  LIPITOR Take 1 tablet (20 mg total) by mouth daily at 6 PM.   carvedilol 6.25 MG tablet Commonly known as:  COREG Take 6.25 mg by mouth 2 (two) times daily with a meal.   docusate sodium 100 MG capsule Commonly known as:  COLACE Take 1 capsule (100 mg total) by mouth 2 (two) times daily.   enoxaparin 40 MG/0.4ML injection Commonly known as:  LOVENOX Inject 0.4 mLs (40 mg total) into [facility] skin daily. For 2 weeks after surgery.   multivitamin capsule Take 1 capsule by mouth daily.   traMADol 50 MG tablet Commonly known as:  ULTRAM Take 1 tablet (50 mg total) by mouth every 6 (six) hours as needed for moderate pain. For no more than 3 days.   verapamil 240 MG (CO) 24 hr tablet Commonly known as:  COVERA HS Take 240 mg by mouth 2 (two) times daily.            Discharge Care Instructions        Start     Ordered   08/01/17 0000  acetaminophen (TYLENOL) 325 MG tablet  Every 6 hours PRN     08/01/17 1033    08/01/17 0000  atorvastatin (LIPITOR) 20 MG tablet  Daily-1800     08/01/17 1033   08/01/17 0000  docusate sodium (COLACE) 100 MG capsule  2 times daily     08/01/17 1033   08/01/17 0000  Increase activity slowly     08/01/17 1033   08/01/17 0000  Call MD for:  severe uncontrolled pain     08/01/17 1033   08/01/17 0000  Call MD for:  persistant nausea and vomiting     08/01/17 1033   08/01/17 0000  Call MD for:  redness, tenderness, or signs of infection (pain, swelling, redness, odor or green/yellow discharge around  incision site)     08/01/17 1033   08/01/17 0000  Call MD for:  difficulty breathing, headache or visual disturbances     08/01/17 1033   08/01/17 0000  Call MD for:  persistant dizziness or light-headedness     08/01/17 1033   08/01/17 0000  Call MD for:  extreme fatigue     08/01/17 1033   07/31/17 0000  enoxaparin (LOVENOX) 40 MG/0.4ML injection  Every 24 hours    Question:  Supervising Provider  Answer:  Wylene Simmer   07/31/17 0807   07/31/17 0000  Call MD / Call 911    Comments:  If you experience chest pain or shortness of breath, CALL 911 and be transported to [facility] hospital emergency room.  If you develope a fever above 101 F, pus (white drainage) or increased drainage or redness at [facility] wound, or calf pain, call your surgeon's office.   07/31/17 0807   07/31/17 0000  Diet - low sodium heart healthy     07/31/17 0807   07/31/17 0000  Constipation Prevention    Comments:  Drink plenty of fluids.  Prune juice may be helpful.  You may use a stool softener, such as Colace (over [facility] counter) 100 mg twice a day.  Use MiraLax (over [facility] counter) for constipation as needed.   07/31/17 0807   07/31/17 0000  Increase activity slowly as tolerated     07/31/17 0807   07/31/17 0000  Weight bearing as tolerated    Question Answer Comment  Laterality left   Extremity Lower      07/31/17 0807   07/31/17 0000  traMADol (ULTRAM) 50 MG tablet  Every 6 hours PRN    Question:   Supervising Provider  Answer:  Wylene Simmer   07/31/17 0973      Contact information for follow-up providers    Wylene Simmer, MD. Schedule an appointment as soon as possible for a visit in 2 week(s).   Specialty:  Orthopedic Surgery Contact information: [address] [place] [address] [phone]        Lawerance Cruel, MD. Schedule an appointment as soon as possible for a visit in 2 week(s).   Specialty:  Family Medicine Why:  Hospital Follow Up  Contact information: [place] [address] [phone]            Contact information for after-discharge care    [facility] Follow up.   Specialty:  [place] information: [address] [place] [phone]                 No Known Allergies Current Discharge Medication List    START taking these medications   Details  acetaminophen (TYLENOL) 325 MG tablet Take 2 tablets (650 mg total) by mouth every 6 (six) hours as needed for mild pain (or Fever >/= 101).    atorvastatin (LIPITOR) 20 MG tablet Take 1 tablet (20 mg total) by mouth daily at 6 PM.    docusate sodium (COLACE) 100 MG capsule Take 1 capsule (100 mg total) by mouth 2 (two) times daily. Qty: 10 capsule, Refills: 0    enoxaparin (LOVENOX) 40 MG/0.4ML injection Inject 0.4 mLs (40 mg total) into [facility] skin daily. For 2 weeks after surgery. Qty: 6 mL, Refills: 0    traMADol (ULTRAM) 50 MG tablet Take 1 tablet (50 mg total) by mouth every 6 (six) hours  as needed for moderate pain. For no more than 3 days. Qty: 30 tablet, Refills: 0      CONTINUE these medications which have NOT CHANGED   Details  carvedilol (COREG) 6.25 MG tablet Take 6.25 mg by mouth 2 (two) times daily with a meal.    Multiple Vitamin (MULTIVITAMIN) capsule Take 1 capsule by mouth daily.    verapamil (COVERA HS) 240 MG (CO) 24 hr tablet Take 240 mg by  mouth 2 (two) times daily.       STOP taking these medications     cloNIDine (CATAPRES) 0.1 MG tablet      Glucosamine 500 MG CAPS      simvastatin (ZOCOR) 40 MG tablet        Procedures/Studies: Dg C-arm 1-60 Min  Result Date: 07/30/2017 CLINICAL DATA:  Left femur fracture fixation EXAM: LEFT FEMUR 2 VIEWS; DG C-ARM 61-120 MIN COMPARISON:  07/30/2017 FINDINGS: Four low resolution intraoperative spot views of [facility] left femur are submitted for interpretation. Total fluoroscopy time was 1 minutes 13.8 seconds. Images were obtained during intramedullary rod and distal screw fixation of [facility] left femur for an intertrochanteric fracture IMPRESSION: Intraoperative fluoroscopic assistance provided during surgical fixation of left intertrochanteric fracture Electronically Signed   By: Donavan Foil M.D.   On: 07/30/2017 21:54   Dg Hip Unilat With Pelvis 2-3 Views Left  Result Date: 07/30/2017 CLINICAL DATA:  Recent fall with left hip pain EXAM: DG HIP (WITH OR WITHOUT PELVIS) 2-3V LEFT COMPARISON:  Lumbar spine films of 07/23/2011 FINDINGS: There is an acute angulated left intertrochanteric femoral fracture present with avulsion of [facility]rystal lesser trochanter. No other abnormality is seen. [facility] pelvic rami are intact. Calcified uterine fibroids are noted in [facility] mid pelvis. IMPRESSION: Acute angulated left femoral intertrochanteric fracture. Electronically Signed   By: Ivar Drape M.D.   On: 07/30/2017 15:10   Dg Femur Min 2 Views Left  Result Date: 07/30/2017 CLINICAL DATA:  Left femur fracture fixation EXAM: LEFT FEMUR 2 VIEWS; DG C-ARM 61-120 MIN COMPARISON:  07/30/2017 FINDINGS: Four low resolution intraoperative spot views of [facility] left femur are submitted for interpretation. Total fluoroscopy time was 1 minutes 13.8 seconds. Images were obtained during intramedullary rod and distal screw fixation of [facility] left femur for an intertrochanteric fracture IMPRESSION: Intraoperative fluoroscopic assistance provided  during surgical fixation of left intertrochanteric fracture Electronically Signed   By: Donavan Foil M.D.   On: 07/30/2017 21:54      Subjective: Pt sitting up in chair today.  No complaints.    Discharge Exam: Vitals:   08/01/17 0838 08/01/17 0958  BP: (!) 124/53 (!) 123/48  Pulse: 73 69  Resp: 18   Temp: 98.3 F (36.8 C)   SpO2: 97%    Vitals:   08/01/17 0019 08/01/17 0517 08/01/17 0838 08/01/17 0958  BP: (!) 127/44 (!) 107/44 (!) 124/53 (!) 123/48  Pulse: (!) 56 (!) 56 73 69  Resp: 16 14 18    Temp: 98.8 F (37.1 C) 98.9 F (37.2 C) 98.3 F (36.8 C)   TempSrc: Oral Oral Oral   SpO2: 96% 93% 97%   Weight:      Height:       General: Pt is alert, awake, not in acute distress Cardiovascular: RRR, S1/S2 +, no rubs, no gallops Respiratory: CTA bilaterally, no wheezing, no rhonchi Abdominal: Soft, NT, ND, bowel sounds + Extremities: wound clean dry intact   [facility] results of significant diagnostics from this hospitalization (including imaging, microbiology, ancillary and  laboratory) are listed below for reference.     Microbiology: No results found for this or any previous visit (from [facility] past 240 hour(s)).   Labs: BNP (last 3 results) No results for input(s): BNP in [facility] last 8760 hours. Basic Metabolic Panel:  Recent Labs Lab 07/30/17 1845 07/31/17 0542  NA 142 139  K 4.3 4.3  CL 109 104  CO2  --  26  GLUCOSE 128* 175*  BUN 11 9  CREATININE 0.90 1.03*  CALCIUM  --  9.7   Liver Function Tests: No results for input(s): AST, ALT, ALKPHOS, BILITOT, PROT, ALBUMIN in [facility] last 168 hours. No results for input(s): LIPASE, AMYLASE in [facility] last 168 hours. No results for input(s): AMMONIA in [facility] last 168 hours. CBC:  Recent Labs Lab 07/30/17 1648 07/30/17 1845 07/31/17 0542  WBC 9.7  --  12.9*  NEUTROABS 7.2  --   --   HGB 11.6* 13.9 10.8*  HCT 37.0 41.0 33.8*  MCV 87.5  --  87.3  PLT 121*  --  239   Cardiac Enzymes: No results for input(s): CKTOTAL,  CKMB, CKMBINDEX, TROPONINI in [facility] last 168 hours. BNP: Invalid input(s): POCBNP CBG: No results for input(s): GLUCAP in [facility] last 168 hours. D-Dimer No results for input(s): DDIMER in [facility] last 72 hours. Hgb A1c No results for input(s): HGBA1C in [facility] last 72 hours. Lipid Profile No results for input(s): CHOL, HDL, LDLCALC, TRIG, CHOLHDL, LDLDIRECT in [facility] last 72 hours. Thyroid function studies  Recent Labs  07/31/17 0542  TSH 1.112   Anemia work up No results for input(s): VITAMINB12, FOLATE, FERRITIN, TIBC, IRON, RETICCTPCT in [facility] last 72 hours. Urinalysis    Component Value Date/Time   COLORURINE YELLOW 07/23/2011 1924   APPEARANCEUR CLOUDY (A) 07/23/2011 1924   LABSPEC 1.022 07/23/2011 1924   PHURINE 5.5 07/23/2011 1924   GLUCOSEU NEGATIVE 07/23/2011 [place] NEGATIVE 07/23/2011 [place] 07/23/2011 [place] 07/23/2011 1924   PROTEINUR NEGATIVE 07/23/2011 1924   UROBILINOGEN 1.0 07/23/2011 1924   NITRITE NEGATIVE 07/23/2011 1924   LEUKOCYTESUR LARGE (A) 07/23/2011 1924   Sepsis Labs Invalid input(s): PROCALCITONIN,  WBC,  LACTICIDVEN Microbiology No results found for this or any previous visit (from [facility] past 240 hour(s)).  Time coordinating discharge: 35 mins  SIGNED:  Irwin Brakeman, MD  [facility] 08/01/2017, 10:34 AM Pager [phone]  If 7PM-7AM, please contact night-coverage [email or web address] Password [email or web address]

## 2017-08-01 NOTE — Anesthesia Postprocedure Evaluation (Signed)
Anesthesia Post Note  Patient: [facility] Chambers  Procedure(s) Performed: Procedure(s) (LRB): INTRAMEDULLARY (IM) NAIL INTERTROCHANTRIC (Left)     Patient location during evaluation: PACU Anesthesia Type: General Level of consciousness: awake and alert Pain management: pain level controlled Vital Signs Assessment: post-procedure vital signs reviewed and stable Respiratory status: spontaneous breathing, nonlabored ventilation, respiratory function stable and patient connected to nasal cannula oxygen Cardiovascular status: blood pressure returned to baseline and stable Postop Assessment: no signs of nausea or vomiting Anesthetic complications: no    Last Vitals:  Vitals:   08/01/17 0019 08/01/17 0517  BP: (!) 127/44 (!) 107/44  Pulse: (!) 56 (!) 56  Resp: 16 14  Temp: 37.1 C 37.2 C  SpO2: 96% 93%    Last Pain:  Vitals:   08/01/17 0517  TempSrc: Oral  PainSc:                  Vinaya Sancho S

## 2019-01-23 ENCOUNTER — Other Ambulatory Visit: Payer: Self-pay | Admitting: Family Medicine

## 2019-01-23 DIAGNOSIS — Z1231 Encounter for screening mammogram for malignant neoplasm of breast: Secondary | ICD-10-CM

## 2019-01-23 DIAGNOSIS — M859 Disorder of bone density and structure, unspecified: Secondary | ICD-10-CM

## 2019-01-23 DIAGNOSIS — M810 Age-related osteoporosis without current pathological fracture: Secondary | ICD-10-CM

## 2019-09-26 ENCOUNTER — Other Ambulatory Visit: Payer: Self-pay

## 2019-09-26 DIAGNOSIS — Z20822 Contact with and (suspected) exposure to covid-19: Secondary | ICD-10-CM

## 2019-09-27 LAB — NOVEL CORONAVIRUS, NAA: SARS-CoV-2, NAA: NOT DETECTED

## 2019-11-09 ENCOUNTER — Other Ambulatory Visit: Payer: Self-pay | Admitting: Cardiology

## 2019-11-09 DIAGNOSIS — Z20822 Contact with and (suspected) exposure to covid-19: Secondary | ICD-10-CM

## 2019-11-10 LAB — NOVEL CORONAVIRUS, NAA: SARS-CoV-2, NAA: NOT DETECTED

## 2020-03-16 ENCOUNTER — Other Ambulatory Visit: Payer: Self-pay | Admitting: Family Medicine

## 2020-03-16 DIAGNOSIS — M859 Disorder of bone density and structure, unspecified: Secondary | ICD-10-CM

## 2020-03-16 DIAGNOSIS — Z1231 Encounter for screening mammogram for malignant neoplasm of breast: Secondary | ICD-10-CM

## 2020-03-16 DIAGNOSIS — M810 Age-related osteoporosis without current pathological fracture: Secondary | ICD-10-CM

## 2020-05-24 ENCOUNTER — Other Ambulatory Visit: Payer: Self-pay

## 2020-05-24 ENCOUNTER — Ambulatory Visit
Admission: RE | Admit: 2020-05-24 | Discharge: 2020-05-24 | Disposition: A | Discharge status: Home / Self Care | Payer: Medicare Other | Source: Ambulatory Visit | Attending: Family Medicine | Admitting: Family Medicine

## 2020-05-24 ENCOUNTER — Other Ambulatory Visit: Payer: Self-pay | Admitting: Family Medicine

## 2020-05-24 DIAGNOSIS — Z1231 Encounter for screening mammogram for malignant neoplasm of breast: Secondary | ICD-10-CM

## 2020-05-24 DIAGNOSIS — M859 Disorder of bone density and structure, unspecified: Secondary | ICD-10-CM

## 2020-05-24 DIAGNOSIS — M810 Age-related osteoporosis without current pathological fracture: Secondary | ICD-10-CM

## 2020-11-13 IMAGING — MG DIGITAL SCREENING UNILAT LEFT W/ TOMO W/ CAD
4 series · 4 of 12 positions shown · non-contrast
Comparison: Previous exam(s).

CLINICAL DATA: Screening.

EXAM:
DIGITAL SCREENING UNILATERAL LEFT MAMMOGRAM WITH CAD AND TOMO

[L CC synth-2D]
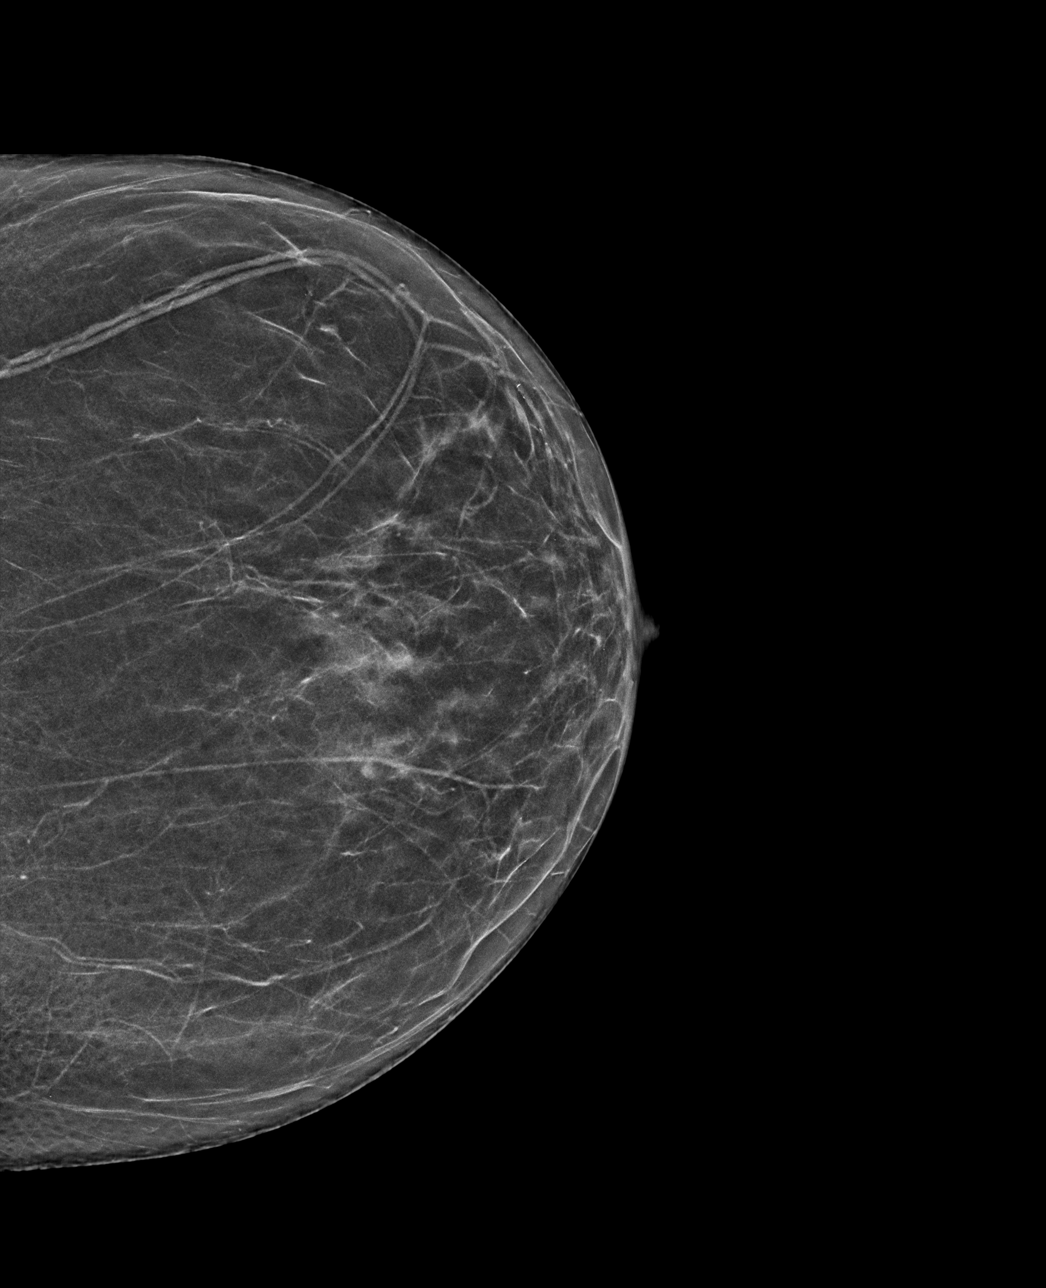

[L MLO synth-2D]
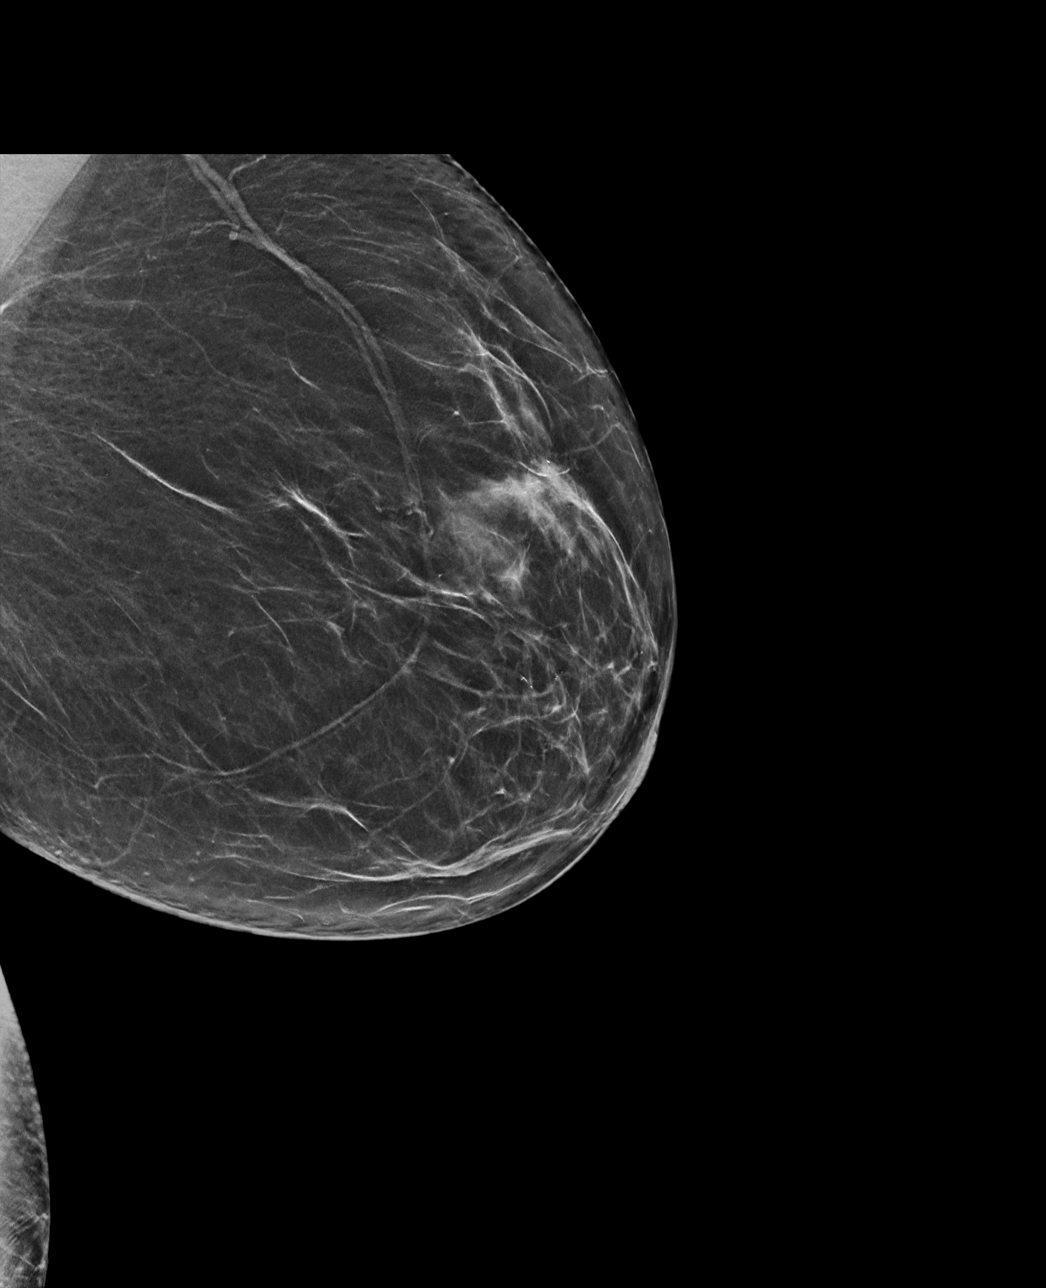

[L CC tomo · tomo slice 28/55.0]
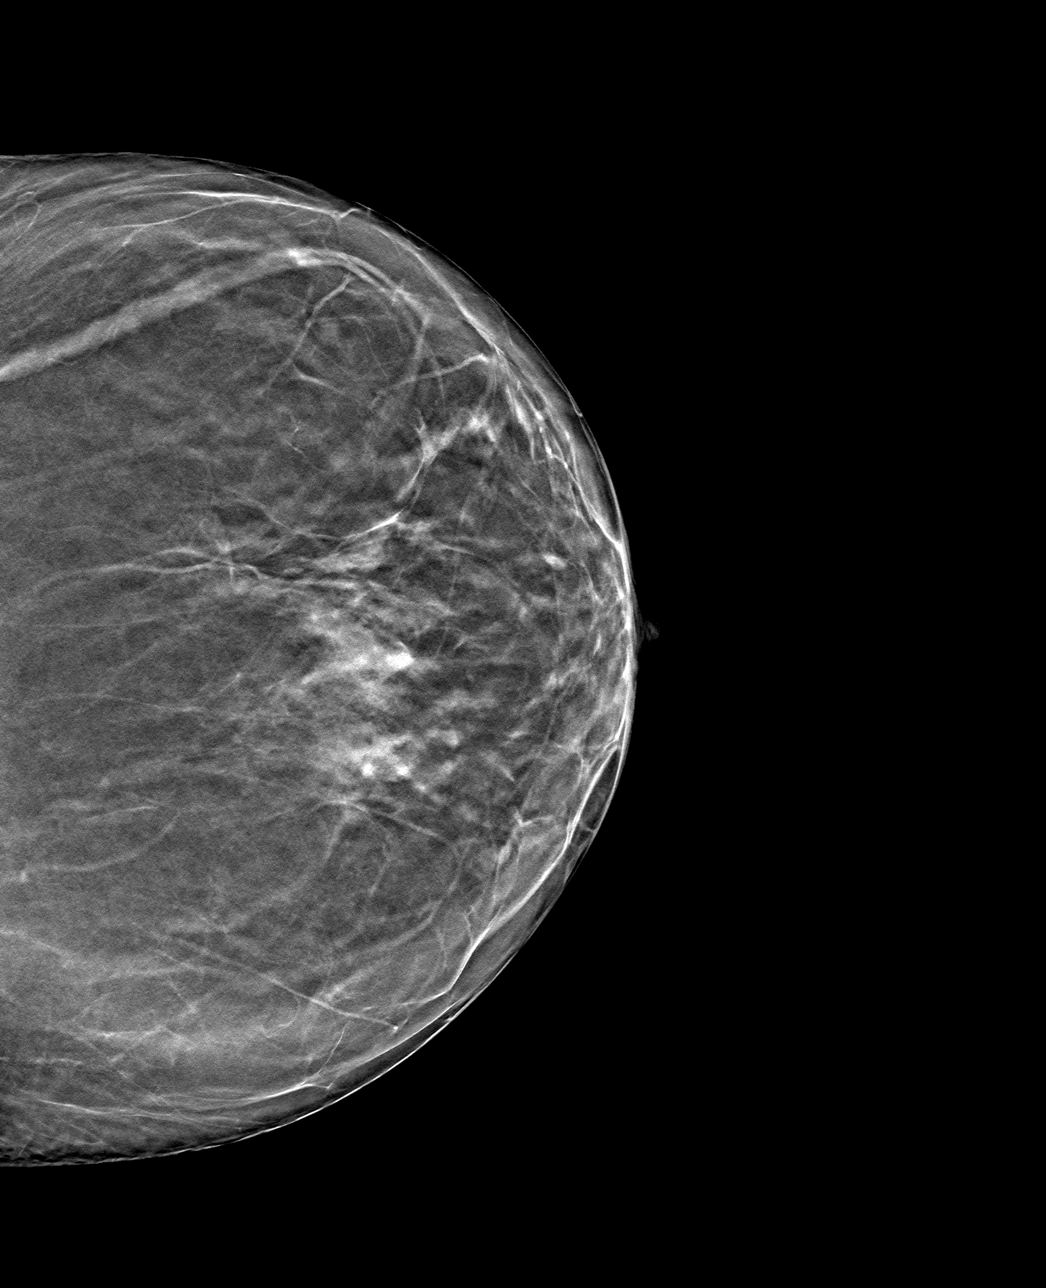

[L MLO tomo · tomo slice 35/70.0]
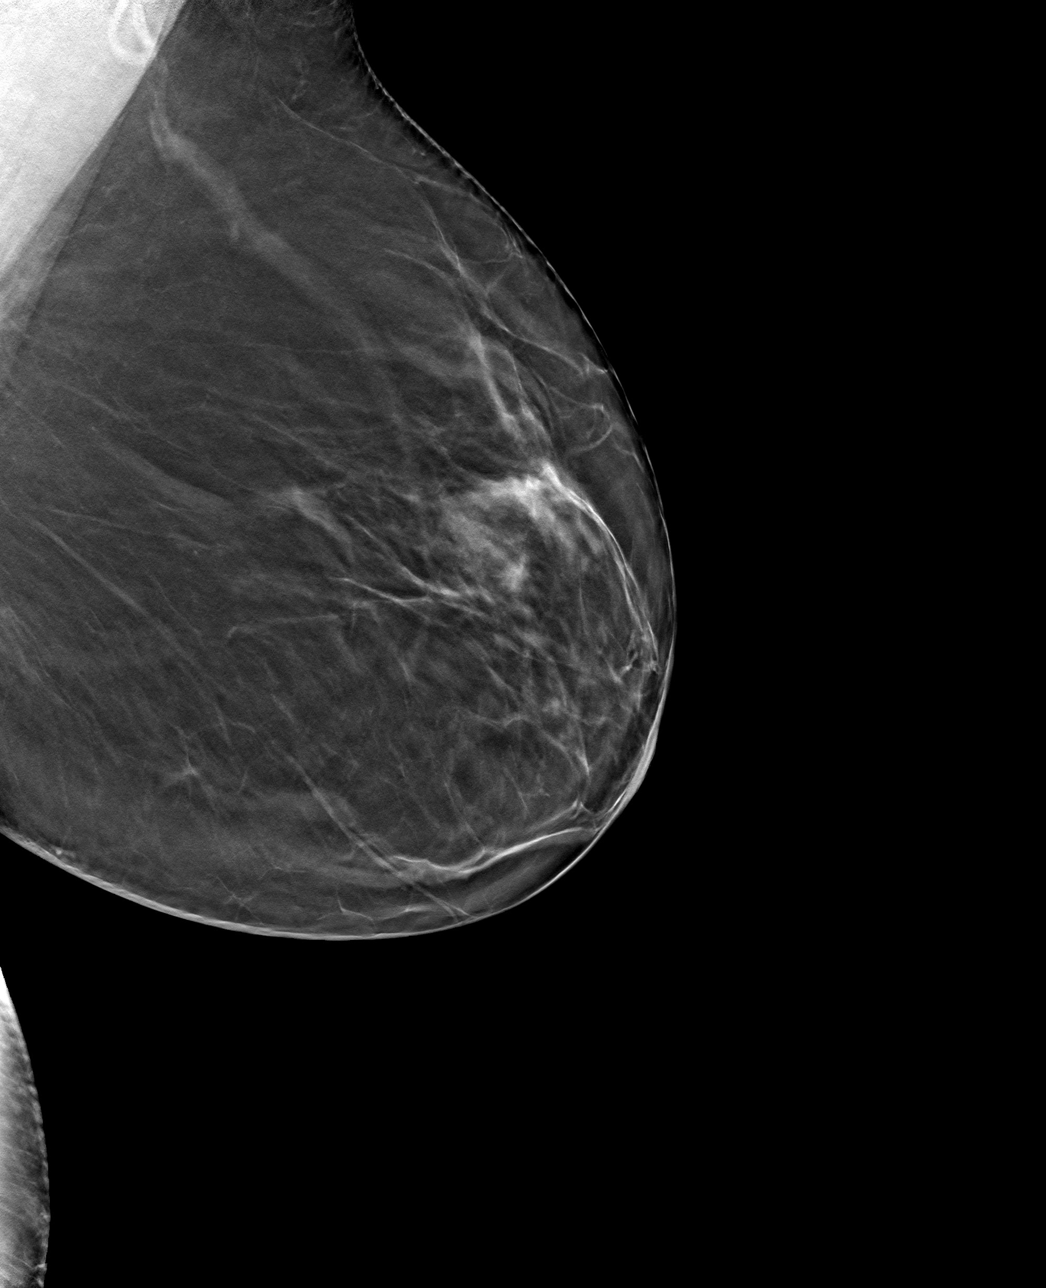

[4 of 12 positions shown; findings below may reference images not displayed]

ACR Breast Density Category b: There are scattered areas of
fibroglandular density.
FINDINGS: The patient has had a right mastectomy. There are no findings
suspicious for malignancy.

Images were processed with CAD.
IMPRESSION: No mammographic evidence of malignancy. A result letter of this
screening mammogram will be mailed directly to the patient.

RECOMMENDATION:
Screening mammogram in one year.  (Code:[id number])

BI-RADS CATEGORY  1: Negative.

## 2023-04-01 ENCOUNTER — Ambulatory Visit: Payer: Medicare Other | Admitting: Cardiology

## 2023-04-01 ENCOUNTER — Encounter: Payer: Self-pay | Admitting: Cardiology

## 2023-04-01 VITALS — BP 163/93 | HR 82 | Resp 16 | Ht <= 58 in | Wt 153.0 lb

## 2023-04-01 DIAGNOSIS — R011 Cardiac murmur, unspecified: Secondary | ICD-10-CM

## 2023-04-01 DIAGNOSIS — I10 Essential (primary) hypertension: Secondary | ICD-10-CM

## 2023-04-01 DIAGNOSIS — R0989 Other specified symptoms and signs involving the circulatory and respiratory systems: Secondary | ICD-10-CM

## 2023-04-01 DIAGNOSIS — R600 Localized edema: Secondary | ICD-10-CM

## 2023-04-01 NOTE — Progress Notes (Unsigned)
Patient referred by Daisy Floro, MD for murmur  Subjective:   [facility] Chambers, female    DOB: 08/22/1935, 88 y.o.   MRN: [id number]   Chief Complaint  Patient presents with   Heart Murmur   Hypertension   New Patient (Initial Visit)    Referred by Dr, Vianne Bulls     HPI  88 y.o. African-American female with hypertension, hyperlipidemia, murmur  Patient is here with her daughter.  She is fairly sedentary lifestyle.  With her limited level of activity, mostly due to arthritis, she has minimal exertional dyspnea.  She denies any presyncope or syncope.  She has mild leg edema.  It appears that she is on metoprolol and amlodipine.  While it is likely that she was also taking either one of them, she has been taking both these medications.  However, blood pressure still remains elevated.   Past Medical History:  Diagnosis Date   Arthritis    "left knee" (07/31/2017)   Cancer of right breast (HCC) 1997   Hypertension    Thyroid disease      Past Surgical History:  Procedure Laterality Date   ABDOMINAL HYSTERECTOMY     pt unsure if she's had a hysterectomy on 07/31/2017   APPENDECTOMY     BREAST BIOPSY Right 1997 X 2   CATARACT EXTRACTION W/ INTRAOCULAR LENS IMPLANT Right ~ 2015   CHOLECYSTECTOMY OPEN     FRACTURE SURGERY     INGUINAL HERNIA REPAIR Right    "not sure if it was a hernia or cyst; felt like sand; had OR; scar still there" (07/31/2017)   INTRAMEDULLARY (IM) NAIL INTERTROCHANTERIC Left 07/30/2017   INTRAMEDULLARY (IM) NAIL INTERTROCHANTERIC Left 07/30/2017   Procedure: INTRAMEDULLARY (IM) NAIL INTERTROCHANTRIC;  Surgeon: Toni Arthurs, MD;  Location: [facility] OR;  Service: Orthopedics;  Laterality: Left;   MASTECTOMY Right 06/18/96   TONSILLECTOMY AND ADENOIDECTOMY       Social History   Tobacco Use  Smoking Status Never  Smokeless Tobacco Never    Social History   Substance and Sexual Activity  Alcohol Use No     Family History  Problem Relation  Age of Onset   Heart disease Father    Heart disease Sister       Current Outpatient Medications:    acetaminophen (TYLENOL) 325 MG tablet, Take 2 tablets (650 mg total) by mouth every 6 (six) hours as needed for mild pain (or Fever >/= 101)., Disp: , Rfl:    amLODipine (NORVASC) 10 MG tablet, Take 10 mg by mouth daily., Disp: , Rfl:    carvedilol (COREG) 6.25 MG tablet, Take 6.25 mg by mouth 2 (two) times daily with a meal., Disp: , Rfl:    Cholecalciferol (VITAMIN D3) 50 MCG (2000 UT) capsule, Take 2,000 Units by mouth daily., Disp: , Rfl:    CRESTOR 20 MG tablet, Take 20 mg by mouth daily., Disp: , Rfl:    hydrochlorothiazide (HYDRODIURIL) 25 MG tablet, Take 25 mg by mouth every morning., Disp: , Rfl:    lisinopril (ZESTRIL) 40 MG tablet, Take 40 mg by mouth daily., Disp: , Rfl:    Multiple Vitamin (MULTIVITAMIN) capsule, Take 1 capsule by mouth daily., Disp: , Rfl:    verapamil (COVERA HS) 240 MG (CO) 24 hr tablet, Take 240 mg by mouth 2 (two) times daily. , Disp: , Rfl:    Cardiovascular and other pertinent studies:  Reviewed external labs and tests, independently interpreted  EKG 04/01/2023: Sinus rhythm  81 bpm LAFB  LVH ST-T changes likely due to LVH    Recent labs: 03/06/2023: Glucose 122, BUN/Cr 23/1.3. EGFR 40. K 4.2. Hb 12.6. Chol 209, TG 166, HDL 51, LDL 128   Review of Systems  Cardiovascular:  Positive for dyspnea on exertion and leg swelling. Negative for chest pain, palpitations and syncope.  Musculoskeletal:  Positive for arthritis.         Vitals:   04/01/23 1245 04/01/23 1253  BP: (!) 168/87 (!) 163/93  Pulse: 74 82  Resp: 16   SpO2: 95%      Body mass index is 31.98 kg/m. Filed Weights   04/01/23 1245  Weight: 153 lb (69.4 kg)     Objective:   Physical Exam Vitals and nursing note reviewed.  Constitutional:      General: She is not in acute distress. Neck:     Vascular: No JVD.  Cardiovascular:     Rate and Rhythm: Normal rate  and regular rhythm.     Heart sounds: Murmur heard.     Harsh midsystolic murmur is present with a grade of 3/6 at the upper right sternal border radiating to the neck.  Pulmonary:     Effort: Pulmonary effort is normal.     Breath sounds: Normal breath sounds. No wheezing or rales.  Musculoskeletal:     Right lower leg: No edema.     Left lower leg: No edema.          Visit diagnoses:   ICD-10-CM   1. Heart murmur  R01.1 EKG 12-Lead    ECHOCARDIOGRAM COMPLETE    2. Bruit  R09.89 Carotid    3. Leg edema  R60.0 Pro b natriuretic peptide (BNP)[facility])       Orders Placed This Encounter  Procedures   EKG 12-Lead     Assessment & Recommendations:   88 y.o. African-American female with hypertension, hyperlipidemia, murmur  Murmur: Suspect aortic stenosis.  She has mild leg edema, mild exertional dyspnea. Will check proBNP and echocardiogram. No changes made today to her patient antihypertensive therapy in case she has severe aortic stenosis.  Mixed hyperlipidemia: Continue Crestor 20 mg daily.  Thank you for referring the patient to [place]. Please feel free to contact with any questions.   Elder Negus, MD Pager: [phone] Office: [phone]

## 2023-04-02 ENCOUNTER — Ambulatory Visit: Payer: Medicare Other | Admitting: Cardiology

## 2023-04-02 ENCOUNTER — Encounter: Payer: Self-pay | Admitting: Cardiology

## 2023-04-02 DIAGNOSIS — R600 Localized edema: Secondary | ICD-10-CM | POA: Insufficient documentation

## 2023-04-02 DIAGNOSIS — R011 Cardiac murmur, unspecified: Secondary | ICD-10-CM | POA: Insufficient documentation

## 2023-04-02 DIAGNOSIS — R0989 Other specified symptoms and signs involving the circulatory and respiratory systems: Secondary | ICD-10-CM | POA: Insufficient documentation

## 2023-04-10 LAB — PRO B NATRIURETIC PEPTIDE: NT-Pro BNP: 89 pg/mL (ref 0–738)

## 2023-04-18 ENCOUNTER — Ambulatory Visit (HOSPITAL_BASED_OUTPATIENT_CLINIC_OR_DEPARTMENT_OTHER)
Admission: RE | Admit: 2023-04-18 | Discharge: 2023-04-18 | Disposition: A | Discharge status: Home / Self Care | Payer: Medicare Other | Source: Ambulatory Visit | Attending: Cardiology | Admitting: Cardiology

## 2023-04-18 ENCOUNTER — Ambulatory Visit (HOSPITAL_COMMUNITY)
Admission: RE | Admit: 2023-04-18 | Discharge: 2023-04-18 | Disposition: A | Discharge status: Home / Self Care | Payer: Medicare Other | Source: Ambulatory Visit | Attending: Cardiology | Admitting: Cardiology

## 2023-04-18 DIAGNOSIS — R011 Cardiac murmur, unspecified: Secondary | ICD-10-CM | POA: Diagnosis not present

## 2023-04-18 DIAGNOSIS — I10 Essential (primary) hypertension: Secondary | ICD-10-CM | POA: Diagnosis not present

## 2023-04-18 DIAGNOSIS — Z9011 Acquired absence of right breast and nipple: Secondary | ICD-10-CM | POA: Insufficient documentation

## 2023-04-18 DIAGNOSIS — C50911 Malignant neoplasm of unspecified site of right female breast: Secondary | ICD-10-CM | POA: Diagnosis not present

## 2023-04-18 DIAGNOSIS — R0989 Other specified symptoms and signs involving the circulatory and respiratory systems: Secondary | ICD-10-CM | POA: Diagnosis not present

## 2023-04-18 DIAGNOSIS — I080 Rheumatic disorders of both mitral and aortic valves: Secondary | ICD-10-CM | POA: Diagnosis present

## 2023-04-18 LAB — ECHOCARDIOGRAM COMPLETE
AR max vel: 0.98 cm2
AV Area VTI: 0.89 cm2
AV Area mean vel: 0.93 cm2
AV Mean grad: 51 mmHg
AV Peak grad: 79.7 mmHg
Ao pk vel: 4.46 m/s
Area-P 1/2: 2.32 cm2
Calc EF: 79.1 %
P 1/2 time: 595 msec
S' Lateral: 2.4 cm
Single Plane A2C EF: 81.8 %
Single Plane A4C EF: 73.4 %

## 2023-04-18 NOTE — Progress Notes (Signed)
Patient referred by Daisy Floro, MD for murmur  Subjective:   Krystal Chambers, female    DOB: [birth year], 88 y.o.   MRN: [id number]   Chief Complaint  Patient presents with   Results   Heart Murmur     HPI  88 y.o. African-American female with hypertension, hyperlipidemia, severe aortic stenosis.  Reviewed recent test results with the patient, details below. Patient's physical activity is limited due to arthritis and knee and hip pain. However, she des report exertional dyspnea with minimal activiy, along with stable leg swelling. She denie schest pain, presyncope, syncope history.   Initial consultation visit 03/2023: Patient is here with her daughter.  She is fairly sedentary lifestyle.  With her limited level of activity, mostly due to arthritis, she has minimal exertional dyspnea.  She denies any presyncope or syncope.  She has mild leg edema.  It appears that she is on metoprolol and amlodipine.  While it is likely that she was also taking either one of them, she has been taking both these medications.  However, blood pressure still remains elevated.   Current Outpatient Medications:    acetaminophen (TYLENOL) 325 MG tablet, Take 2 tablets (650 mg total) by mouth every 6 (six) hours as needed for mild pain (or Fever >/= 101)., Disp: , Rfl:    amLODipine (NORVASC) 10 MG tablet, Take 10 mg by mouth daily., Disp: , Rfl:    carvedilol (COREG) 6.25 MG tablet, Take 6.25 mg by mouth 2 (two) times daily with a meal., Disp: , Rfl:    Cholecalciferol (VITAMIN D3) 50 MCG (2000 UT) capsule, Take 2,000 Units by mouth daily., Disp: , Rfl:    CRESTOR 20 MG tablet, Take 20 mg by mouth daily., Disp: , Rfl:    hydrochlorothiazide (HYDRODIURIL) 25 MG tablet, Take 25 mg by mouth every morning., Disp: , Rfl:    lisinopril (ZESTRIL) 40 MG tablet, Take 40 mg by mouth daily., Disp: , Rfl:    Multiple Vitamin (MULTIVITAMIN) capsule, Take 1 capsule by mouth daily., Disp: , Rfl:    verapamil  (COVERA HS) 240 MG (CO) 24 hr tablet, Take 240 mg by mouth 2 (two) times daily. 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   Left Carotid: The extracranial vessels were near-normal with only minimal wall thickening or plaque.   Vertebrals:  Bilateral vertebral arteries demonstrate antegrade flow. Subclavians: Normal flow hemodynamics were seen in bilateral subclavian arteries.  Echocardiogram 04/18/2023:  1. Left ventricular ejection fraction, by estimation, is 60 to 65%. The left ventricle has normal function. The left ventricle has no regional wall motion abnormalities. There is severe left ventricular hypertrophy. Left ventricular diastolic parameters  are consistent with Grade I diastolic dysfunction (impaired relaxation).  2. Right ventricular systolic function is normal. The right ventricular size is normal. There is normal pulmonary artery systolic pressure.  3. Left atrial size was mildly dilated.  4. The mitral valve is abnormal. Mild mitral valve regurgitation. No evidence of mitral stenosis.  5. The aortic valve is tricuspid. There is severe calcifcation of the aortic valve. There is severe thickening of the aortic valve. Aortic valve regurgitation is mild. Severe aortic valve stenosis.  V-max 4.2 m/s, mean pressure  gradient 44 mmHg, AVA 0.9 cm by continuity question.  6. The inferior vena cava is normal in size with greater than 50% respiratory variability, suggesting right atrial pressure of 3 mmHg.  Recent labs: 03/06/2023: Glucose 122, BUN/Cr 23/1.3. EGFR 40. K 4.2. Hb 12.6. Chol 209, TG 166, HDL 51, LDL 128   Review of Systems  Cardiovascular:  Positive for dyspnea on  exertion and leg swelling. Negative for chest pain, palpitations and syncope.  Musculoskeletal:  Positive for arthritis.         Vitals:   04/26/23 1050  BP: (!) 168/78  Pulse: 72  Resp: 16  SpO2: 94%     Body mass index is 31.98 kg/m. Filed Weights   04/26/23 1050  Weight: 153 lb (69.4 kg)     Objective:   Physical Exam Vitals and nursing note reviewed.  Constitutional:      General: She is not in acute distress. Neck:     Vascular: No JVD.  Cardiovascular:     Rate and Rhythm: Normal rate and regular rhythm.     Heart sounds: Murmur heard.     Harsh midsystolic murmur is present with a grade of 3/6 at the upper right sternal border radiating to the neck.  Pulmonary:     Effort: Pulmonary effort is normal.     Breath sounds: Normal breath sounds. No wheezing or rales.  Musculoskeletal:     Right lower leg: No edema.     Left lower leg: No edema.          Visit diagnoses:   ICD-10-CM   1. Severe aortic stenosis  I35.0 CBC    Basic metabolic panel    Ambulatory referral to Structural Heart/Valve Clinic (only at [facility])    2. Essential hypertension  I10        Orders Placed This Encounter  Procedures   CBC   Basic metabolic panel   Ambulatory referral to Structural Heart/Valve Clinic (only at [facility])     Assessment & Recommendations:   88 y.o. African-American female with hypertension, hyperlipidemia, murmur  Severe aortic stenosis: Severe aortic valve stenosis (stage D). Normal EF, proBNP. However, she does have leg edema, mild exertional dyspnea NYHA class II. Dyspnea could be underestimated given her low physical activity level due to arthritis. Started lasix 40 mg daily. Discussed the rationale of aortic valve replacement for prognostic benefit. Placed referral to TAVR clinic. I will proceed with right heart cath, coronary angiography. Discussed risks, benefits, alternate options in details, especially CIN risk given her GFR 40. Will  provide gentle hydration before cath.  Hypertension: Uncontrolled. I would avoid aggressive treatment until after valve replacement to avoid hypotension in the setting of severe aortic stenosis. Hopefully lasix will help some.  Mixed hyperlipidemia: Continue Crestor 20 mg daily.  Thank you for referring the patient to [place]. Please feel free to contact with any questions.   Elder Negus, MD Pager: [phone] Office: [phone]

## 2023-04-26 ENCOUNTER — Encounter: Payer: Self-pay | Admitting: Cardiology

## 2023-04-26 ENCOUNTER — Ambulatory Visit: Payer: Medicare Other | Admitting: Cardiology

## 2023-04-26 VITALS — BP 168/78 | HR 72 | Resp 16 | Ht <= 58 in | Wt 153.0 lb

## 2023-04-26 DIAGNOSIS — I10 Essential (primary) hypertension: Secondary | ICD-10-CM

## 2023-04-26 DIAGNOSIS — I350 Nonrheumatic aortic (valve) stenosis: Secondary | ICD-10-CM | POA: Insufficient documentation

## 2023-04-26 MED ORDER — FUROSEMIDE 40 MG PO TABS: 40.0000 mg | ORAL_TABLET | Freq: Every day | ORAL | 3 refills | Status: DC

## 2023-05-01 ENCOUNTER — Encounter: Payer: Self-pay | Admitting: Cardiovascular Disease

## 2023-05-01 ENCOUNTER — Ambulatory Visit: Payer: Medicare Other | Attending: Cardiovascular Disease | Admitting: Cardiovascular Disease

## 2023-05-01 VITALS — BP 142/78 | HR 70 | Ht <= 58 in | Wt 155.8 lb

## 2023-05-01 DIAGNOSIS — I350 Nonrheumatic aortic (valve) stenosis: Secondary | ICD-10-CM

## 2023-05-01 NOTE — Progress Notes (Signed)
Structural Heart Clinic Consult Note  Chief Complaint  Patient presents with   New Patient (Initial Visit)    Severe aortic stenosis   History of Present Illness: 88 yo female with history of HTN, hyperlipidemia, thyroid disease, breast cancer and recent finding of severe aortic valve stenosis who is here today as a new consult, referred by Dr. Rosemary Holms, for further discussion regarding her aortic stenosis and possible TAVR. She has a history of breast cancer in 1997 treated with mastectomy. She was seen by Dr. Rosemary Holms in April 2024 for evaluation of a cardia murmur and reported some dyspnea on exertion. Echo 04/18/23 with LVEF=60-65%. Severe LVH. Normal RV function. Mild mitral regurgitation. Severe aortic stenosis with mean gradient 51 mmHg, AVA 0.9 cm2, DI 0.28. Mild AI. She is not known to have CAD. No prior cardiac cath.   She tells me today that she is limited by her arthritis due to knee pain and is sedentary. With normal exertion she has dyspnea and fatigue. She has mild lower extremity edema and is now on Lasix. No chest pain, dizziness, near syncope or syncope. She lives in [place] alone. Her daughter is here today with her and is very involved. She is retired as a Agricultural engineer. She has full dentures.   Primary Care Physician: Daisy Floro, MD Primary Cardiologist: Rosemary Holms Referring Cardiologist: Rosemary Holms  Past Medical History:  Diagnosis Date   Aortic stenosis    Arthritis    "left knee" (07/31/2017)   Cancer of right breast (HCC) 1997   Hypertension    Thyroid disease     Past Surgical History:  Procedure Laterality Date   ABDOMINAL HYSTERECTOMY     pt unsure if she's had a hysterectomy on 07/31/2017   APPENDECTOMY     BREAST BIOPSY Right 1997 X 2   CATARACT EXTRACTION W/ INTRAOCULAR LENS IMPLANT Right ~ 2015   CHOLECYSTECTOMY OPEN     FRACTURE SURGERY     INGUINAL HERNIA REPAIR Right    "not sure if it was a hernia or cyst; felt like sand; had  OR; scar still there" (07/31/2017)   INTRAMEDULLARY (IM) NAIL INTERTROCHANTERIC Left 07/30/2017   INTRAMEDULLARY (IM) NAIL INTERTROCHANTERIC Left 07/30/2017   Procedure: INTRAMEDULLARY (IM) NAIL INTERTROCHANTRIC;  Surgeon: Toni Arthurs, MD;  Location: [facility] OR;  Service: Orthopedics;  Laterality: Left;   MASTECTOMY Right 06/18/96   TONSILLECTOMY AND ADENOIDECTOMY      Current Outpatient Medications  Medication Sig Dispense Refill   acetaminophen (TYLENOL) 325 MG tablet Take 2 tablets (650 mg total) by mouth every 6 (six) hours as needed for mild pain (or Fever >/= 101).     amLODipine (NORVASC) 10 MG tablet Take 10 mg by mouth daily.     carvedilol (COREG) 6.25 MG tablet Take 6.25 mg by mouth 2 (two) times daily with a meal.     Cholecalciferol (VITAMIN D3) 50 MCG (2000 UT) capsule Take 2,000 Units by mouth daily.     CRESTOR 20 MG tablet Take 20 mg by mouth daily.     furosemide (LASIX) 40 MG tablet Take 1 tablet (40 mg total) by mouth daily. 30 tablet 3   hydrochlorothiazide (HYDRODIURIL) 25 MG tablet Take 25 mg by mouth every morning.     lisinopril (ZESTRIL) 40 MG tablet Take 40 mg by mouth daily.     Multiple Vitamin (MULTIVITAMIN) capsule Take 1 capsule by mouth daily.     verapamil (COVERA HS) 240 MG (CO) 24 hr tablet Take 240 mg by  mouth 2 (two) times daily.      No current facility-administered medications for this visit.    No Known Allergies  Social History   Socioeconomic History   Marital status: Widowed    Spouse name: Not on file   Number of children: 3   Years of education: Not on file   Highest education level: Not on file  Occupational History   Occupation: Retired-nursing Geophysicist/field seismologist  Tobacco Use   Smoking status: Never   Smokeless tobacco: Never  Vaping Use   Vaping Use: Never used  Substance and Sexual Activity   Alcohol use: No   Drug use: No   Sexual activity: Never  Other Topics Concern   Not on file  Social History Narrative   Not on file   Social  Determinants of Health   Financial Resource Strain: Not on file  Food Insecurity: Not on file  Transportation Needs: Not on file  Physical Activity: Not on file  Stress: Not on file  Social Connections: Not on file  Intimate Partner Violence: Not on file    Family History  Problem Relation Age of Onset   Heart disease Mother    Heart disease Father     Review of Systems:  As stated in the HPI and otherwise negative.   BP (!) 142/78   Pulse 70   Ht 4\' 10"  (1.473 m)   Wt 70.7 kg   SpO2 94%   BMI 32.56 kg/m   Physical Examination: General: Well developed, well nourished, NAD  HEENT: OP clear, mucus membranes moist  SKIN: warm, dry. No rashes. Neuro: No focal deficits  Musculoskeletal: Muscle strength 5/5 all ext  Psychiatric: Mood and affect normal  Neck: No JVD, no carotid bruits, no thyromegaly, no lymphadenopathy.  Lungs:Clear bilaterally, no wheezes, rhonci, crackles Cardiovascular: Regular rate and rhythm. Loud, harsh, late peaking systolic murmur.  Abdomen:Soft. Bowel sounds present. Non-tender.  Extremities: No lower extremity edema. Pulses are 2 + in the bilateral DP/PT.  EKG:  EKG is not ordered today. The ekg from 04/01/23 is reviewed today and shows sinus with LAFB, LVH  Echo May 2024:  1. Left ventricular ejection fraction, by estimation, is 60 to 65%. The  left ventricle has normal function. The left ventricle has no regional  wall motion abnormalities. There is severe left ventricular hypertrophy.  Left ventricular diastolic parameters   are consistent with Grade I diastolic dysfunction (impaired relaxation).   2. Right ventricular systolic function is normal. The right ventricular  size is normal. There is normal pulmonary artery systolic pressure.   3. Left atrial size was mildly dilated.   4. The mitral valve is abnormal. Mild mitral valve regurgitation. No  evidence of mitral stenosis.   5. The aortic valve is tricuspid. There is severe calcifcation  of the  aortic valve. There is severe thickening of the aortic valve. Aortic valve  regurgitation is mild. Severe aortic valve stenosis.   6. The inferior vena cava is normal in size with greater than 50%  respiratory variability, suggesting right atrial pressure of 3 mmHg.   FINDINGS   Left Ventricle: Left ventricular ejection fraction, by estimation, is 60  to 65%. The left ventricle has normal function. The left ventricle has no  regional wall motion abnormalities. The left ventricular internal cavity  size was normal in size. There is   severe left ventricular hypertrophy. Left ventricular diastolic  parameters are consistent with Grade I diastolic dysfunction (impaired  relaxation).   Right Ventricle:  The right ventricular size is normal. No increase in  right ventricular wall thickness. Right ventricular systolic function is  normal. There is normal pulmonary artery systolic pressure. The tricuspid  regurgitant velocity is 2.37 m/s, and   with an assumed right atrial pressure of 3 mmHg, the estimated right  ventricular systolic pressure is 25.5 mmHg.   Left Atrium: Left atrial size was mildly dilated.   Right Atrium: Right atrial size was normal in size.   Pericardium: There is no evidence of pericardial effusion.   Mitral Valve: The mitral valve is abnormal. There is mild thickening of  the mitral valve leaflet(s). There is mild calcification of the mitral  valve leaflet(s). Mild mitral annular calcification. Mild mitral valve  regurgitation. No evidence of mitral  valve stenosis.   Tricuspid Valve: The tricuspid valve is normal in structure. Tricuspid  valve regurgitation is mild . No evidence of tricuspid stenosis.   Aortic Valve: The aortic valve is tricuspid. There is severe calcifcation  of the aortic valve. There is severe thickening of the aortic valve.  Aortic valve regurgitation is mild. Aortic regurgitation PHT measures 595  msec. Severe aortic stenosis is   present. Aortic valve mean gradient measures 51.0 mmHg. Aortic valve peak  gradient measures 79.7 mmHg. Aortic valve area, by VTI measures 0.89 cm.   Pulmonic Valve: The pulmonic valve was normal in structure. Pulmonic valve  regurgitation is not visualized. No evidence of pulmonic stenosis.   Aorta: The aortic root is normal in size and structure.   Venous: The inferior vena cava is normal in size with greater than 50%  respiratory variability, suggesting right atrial pressure of 3 mmHg.   IAS/Shunts: No atrial level shunt detected by color flow Doppler.     LEFT VENTRICLE  PLAX 2D  LVIDd:         3.90 cm     Diastology  LVIDs:         2.40 cm     LV e' medial:    5.22 cm/s  LV PW:         1.30 cm     LV E/e' medial:  9.8  LV IVS:        1.70 cm     LV e' lateral:   5.44 cm/s  LVOT diam:     2.00 cm     LV E/e' lateral: 9.4  LV SV:         95  LV SV Index:   58  LVOT Area:     3.14 cm    LV Volumes (MOD)  LV vol d, MOD A2C: 52.0 ml  LV vol d, MOD A4C: 47.8 ml  LV vol s, MOD A2C: 9.5 ml  LV vol s, MOD A4C: 12.7 ml  LV SV MOD A2C:     42.5 ml  LV SV MOD A4C:     47.8 ml  LV SV MOD BP:      39.7 ml   RIGHT VENTRICLE  RV Basal diam:  2.80 cm  RV S prime:     9.57 cm/s  TAPSE (M-mode): 2.0 cm   LEFT ATRIUM             Index        RIGHT ATRIUM          Index  LA diam:        3.70 cm 2.28 cm/m   RA Area:     9.09 cm  LA Vol (A2C):  42.9 ml 26.40 ml/m  RA Volume:   16.20 ml 9.97 ml/m  LA Vol (A4C):   39.8 ml 24.49 ml/m  LA Biplane Vol: 43.9 ml 27.01 ml/m   AORTIC VALVE                     PULMONIC VALVE  AV Area (Vmax):    0.98 cm      PV Vmax:          0.99 m/s  AV Area (Vmean):   0.93 cm      PV Peak grad:     3.9 mmHg  AV Area (VTI):     0.89 cm      PR End Diast Vel: 6.10 msec  AV Vmax:           446.33 cm/s  AV Vmean:          340.333 cm/s  AV VTI:            1.063 m  AV Peak Grad:      79.7 mmHg  AV Mean Grad:      51.0 mmHg  LVOT Vmax:          139.00 cm/s  LVOT Vmean:        101.000 cm/s  LVOT VTI:          0.302 m  LVOT/AV VTI ratio: 0.28  AI PHT:            595 msec    AORTA  Ao Root diam: 2.80 cm  Ao Asc diam:  3.20 cm   MITRAL VALVE                TRICUSPID VALVE  MV Area (PHT): 2.32 cm     TR Peak grad:   22.5 mmHg  MV Decel Time: 327 msec     TR Vmax:        237.00 cm/s  MV E velocity: 51.40 cm/s  MV A velocity: 116.00 cm/s  SHUNTS  MV E/A ratio:  0.44         Systemic VTI:  0.30 m                              Systemic Diam: 2.00 cm  Recent Labs: 04/09/2023: NT-Pro BNP 89   Lipid Panel No results found for: "CHOL", "TRIG", "HDL", "CHOLHDL", "VLDL", "LDLCALC", "LDLDIRECT"   Wt Readings from Last 3 Encounters:  05/01/23 70.7 kg  04/26/23 69.4 kg  04/01/23 69.4 kg     Assessment and Plan:   1. Severe Aortic Valve Stenosis: She has severe, stage D1 aortic valve stenosis. She has NYHA class 2 symptoms. I have personally reviewed the echo images. The aortic valve is thickened and calcified with limited leaflet mobility. I think she would benefit from AVR. Given advanced age, she is not a good candidate for conventional AVR by surgical approach. I think she may be a good candidate for TAVR.   I have reviewed the natural history of aortic stenosis with the patient and their family members  who are present today. We have discussed the limitations of medical therapy and the poor prognosis associated with symptomatic aortic stenosis. We have reviewed potential treatment options, including palliative medical therapy, conventional surgical aortic valve replacement, and transcatheter aortic valve replacement. We discussed treatment options in the context of the patient's specific comorbid medical conditions.   She would like to proceed with planning  for TAVR. I will discuss arranging a right heart cath and coronary angiography with Dr. Rosemary Holms. BMET and CBC today pre-cath. Risks and benefits of the cath procedure and the valve  procedure are reviewed with the patient. After the cath, she will have a cardiac CT, CTA of the chest/abdomen and pelvis and will then be referred to see one of the CT surgeons on our TAVR team.     Labs/ tests ordered today include:   Orders Placed This Encounter  Procedures   CBC   Basic metabolic panel   Disposition:   F/U will be arranged with the structural team  Signed, Verne Carrow, MD, [facility] 05/01/2023 2:30 PM    [facility] [address] [place], [place], [place]  [address] Phone: [phone]; Fax: [phone]

## 2023-05-01 NOTE — Patient Instructions (Addendum)
  Medication Instructions:  Your physician recommends that you continue on your current medications as directed. Please refer to the Current Medication list given to you today.  *If you need a refill on your cardiac medications before your next appointment, please call your pharmacy*   Lab Work: TODAY: BMP, CBC  If you have labs (blood work) drawn today and your tests are completely normal, you will receive your results only by: MyChart Message (if you have MyChart) OR A paper copy in the mail If you have any lab test that is abnormal or we need to change your treatment, we will call you to review the results.   Testing/Procedures: NONE   Follow-Up:to be determined  At [facility], you and your health needs are our priority.  As part of our continuing mission to provide you with exceptional heart care, we have created designated Provider Care Teams.  These Care Teams include your primary Cardiologist (physician) and Advanced Practice Providers (APPs -  Physician Assistants and Nurse Practitioners) who all work together to provide you with the care you need, when you need it.   Provider:   Verne Carrow, MD

## 2023-05-01 NOTE — Progress Notes (Addendum)
Pre Surgical Assessment: 5 M Walk Test  41M=16.64ft  5 Meter Walk Test- trial 1: 6.53 seconds 5 Meter Walk Test- trial 2: 7.43 seconds 5 Meter Walk Test- trial 3: 6.88 seconds 5 Meter Walk Test Average: 6.95 seconds   __________________________  Procedure Type: Isolated AVR PERIOPERATIVE OUTCOME ESTIMATE % Operative Mortality 6.29% Morbidity & Mortality 10.1% Stroke 1.53% Renal Failure 2.22% Reoperation 3.17% Prolonged Ventilation 6.4% Deep Sternal Wound Infection 0.062% Long Hospital Stay (>14 days) 5.13% Short Hospital Stay (<6 days)* 32.6%

## 2023-05-02 LAB — BASIC METABOLIC PANEL
BUN/Creatinine Ratio: 12 (ref 12–28)
BUN: 11 mg/dL (ref 8–27)
CO2: 25 mmol/L (ref 20–29)
Calcium: 10.8 mg/dL — ABNORMAL HIGH (ref 8.7–10.3)
Chloride: 104 mmol/L (ref 96–106)
Creatinine, Ser: 0.93 mg/dL (ref 0.57–1.00)
Glucose: 126 mg/dL — ABNORMAL HIGH (ref 70–99)
Potassium: 3.8 mmol/L (ref 3.5–5.2)
Sodium: 143 mmol/L (ref 134–144)
eGFR: 60 mL/min/{1.73_m2} (ref >59)

## 2023-05-02 LAB — CBC
Hematocrit: 38.8 % (ref 34.0–46.6)
Hemoglobin: 12.5 g/dL (ref 11.1–15.9)
MCH: 27.8 pg (ref 26.6–33.0)
MCHC: 32.2 g/dL (ref 31.5–35.7)
MCV: 86 fL (ref 79–97)
Platelets: 236 10*3/uL (ref 150–450)
RBC: 4.49 x10E6/uL (ref 3.77–5.28)
RDW: 13.4 % (ref 11.7–15.4)
WBC: 8.5 10*3/uL (ref 3.4–10.8)

## 2023-05-02 NOTE — Progress Notes (Signed)
Will do. Staff , please arrange for LRCP requested at last visit.  Thanks MJP

## 2023-05-07 ENCOUNTER — Other Ambulatory Visit: Payer: Self-pay

## 2023-05-07 ENCOUNTER — Encounter (HOSPITAL_COMMUNITY)
Admission: RE | Disposition: A | Discharge status: Home / Self Care | Payer: Self-pay | Source: Home / Self Care | Attending: Cardiology

## 2023-05-07 ENCOUNTER — Ambulatory Visit (HOSPITAL_COMMUNITY)
Admission: RE | Admit: 2023-05-07 | Discharge: 2023-05-07 | Disposition: A | Discharge status: Home / Self Care | Payer: Medicare Other | Attending: Cardiology | Admitting: Cardiology

## 2023-05-07 DIAGNOSIS — I350 Nonrheumatic aortic (valve) stenosis: Secondary | ICD-10-CM | POA: Diagnosis present

## 2023-05-07 DIAGNOSIS — E782 Mixed hyperlipidemia: Secondary | ICD-10-CM | POA: Insufficient documentation

## 2023-05-07 DIAGNOSIS — Z79899 Other long term (current) drug therapy: Secondary | ICD-10-CM | POA: Insufficient documentation

## 2023-05-07 DIAGNOSIS — I253 Aneurysm of heart: Secondary | ICD-10-CM | POA: Diagnosis not present

## 2023-05-07 DIAGNOSIS — I10 Essential (primary) hypertension: Secondary | ICD-10-CM | POA: Insufficient documentation

## 2023-05-07 HISTORY — PX: RIGHT HEART CATH AND CORONARY ANGIOGRAPHY: CATH118264

## 2023-05-07 LAB — POCT I-STAT 7, (LYTES, BLD GAS, ICA,H+H)
Acid-Base Excess: 0 mmol/L (ref 0.0–2.0)
Bicarbonate: 25.3 mmol/L (ref 20.0–28.0)
Calcium, Ion: 1.32 mmol/L (ref 1.15–1.40)
HCT: 36 % (ref 36.0–46.0)
Hemoglobin: 12.2 g/dL (ref 12.0–15.0)
O2 Saturation: 91 %
Potassium: 3.3 mmol/L — ABNORMAL LOW (ref 3.5–5.1)
Sodium: 143 mmol/L (ref 135–145)
TCO2: 27 mmol/L (ref 22–32)
pCO2 arterial: 40.9 mmHg (ref 32–48)
pH, Arterial: 7.4 (ref 7.35–7.45)
pO2, Arterial: 62 mmHg — ABNORMAL LOW (ref 83–108)

## 2023-05-07 LAB — POCT I-STAT EG7
Acid-Base Excess: 2 mmol/L (ref 0.0–2.0)
Bicarbonate: 27.5 mmol/L (ref 20.0–28.0)
Calcium, Ion: 1.36 mmol/L (ref 1.15–1.40)
HCT: 37 % (ref 36.0–46.0)
Hemoglobin: 12.6 g/dL (ref 12.0–15.0)
O2 Saturation: 70 %
Potassium: 3.3 mmol/L — ABNORMAL LOW (ref 3.5–5.1)
Sodium: 143 mmol/L (ref 135–145)
TCO2: 29 mmol/L (ref 22–32)
pCO2, Ven: 44.9 mmHg (ref 44–60)
pH, Ven: 7.396 (ref 7.25–7.43)
pO2, Ven: 37 mmHg (ref 32–45)

## 2023-05-07 SURGERY — RIGHT HEART CATH AND CORONARY ANGIOGRAPHY
Anesthesia: LOCAL

## 2023-05-07 MED ORDER — MIDAZOLAM HCL 2 MG/2ML IJ SOLN
INTRAMUSCULAR | Status: DC | PRN
  Administered 2023-05-07: .5 mg via INTRAVENOUS

## 2023-05-07 MED ORDER — LABETALOL HCL 5 MG/ML IV SOLN: 10.0000 mg | INTRAVENOUS | Status: DC | PRN

## 2023-05-07 MED ORDER — VERAPAMIL HCL 2.5 MG/ML IV SOLN
INTRAVENOUS | Status: DC | PRN
  Administered 2023-05-07: 10 mL via INTRA_ARTERIAL

## 2023-05-07 MED ORDER — FENTANYL CITRATE (PF) 100 MCG/2ML IJ SOLN
INTRAMUSCULAR | Status: AC
  Filled 2023-05-07: qty 2

## 2023-05-07 MED ORDER — VERAPAMIL HCL 2.5 MG/ML IV SOLN
INTRAVENOUS | Status: AC
  Filled 2023-05-07: qty 2

## 2023-05-07 MED ORDER — ONDANSETRON HCL 4 MG/2ML IJ SOLN: 4.0000 mg | Freq: Four times a day (QID) | INTRAMUSCULAR | Status: DC | PRN

## 2023-05-07 MED ORDER — LIDOCAINE HCL (PF) 1 % IJ SOLN
INTRAMUSCULAR | Status: AC
  Filled 2023-05-07: qty 30

## 2023-05-07 MED ORDER — ASPIRIN 81 MG PO CHEW: 81.0000 mg | CHEWABLE_TABLET | ORAL | Status: DC

## 2023-05-07 MED ORDER — HEPARIN (PORCINE) IN NACL 1000-0.9 UT/500ML-% IV SOLN
INTRAVENOUS | Status: DC | PRN
  Administered 2023-05-07 (×2): 500 mL

## 2023-05-07 MED ORDER — SODIUM CHLORIDE 0.9 % IV SOLN: 250.0000 mL | INTRAVENOUS | Status: DC | PRN

## 2023-05-07 MED ORDER — SODIUM CHLORIDE 0.9 % WEIGHT BASED INFUSION
1.0000 mL/kg/h | INTRAVENOUS | Status: DC
  Administered 2023-05-07: 1 mL/kg/h via INTRAVENOUS

## 2023-05-07 MED ORDER — SODIUM CHLORIDE 0.9% FLUSH: 3.0000 mL | Freq: Two times a day (BID) | INTRAVENOUS | Status: DC

## 2023-05-07 MED ORDER — HEPARIN SODIUM (PORCINE) 1000 UNIT/ML IJ SOLN
INTRAMUSCULAR | Status: AC
  Filled 2023-05-07: qty 10

## 2023-05-07 MED ORDER — MIDAZOLAM HCL 2 MG/2ML IJ SOLN
INTRAMUSCULAR | Status: AC
  Filled 2023-05-07: qty 2

## 2023-05-07 MED ORDER — LIDOCAINE HCL (PF) 1 % IJ SOLN
INTRAMUSCULAR | Status: DC | PRN
  Administered 2023-05-07 (×2): 2 mL

## 2023-05-07 MED ORDER — SODIUM CHLORIDE 0.9 % IV SOLN: INTRAVENOUS | Status: AC

## 2023-05-07 MED ORDER — FENTANYL CITRATE (PF) 100 MCG/2ML IJ SOLN
INTRAMUSCULAR | Status: DC | PRN
  Administered 2023-05-07: 12.5 ug via INTRAVENOUS

## 2023-05-07 MED ORDER — SODIUM CHLORIDE 0.9% FLUSH: 3.0000 mL | INTRAVENOUS | Status: DC | PRN

## 2023-05-07 MED ORDER — IOHEXOL 350 MG/ML SOLN
INTRAVENOUS | Status: DC | PRN
  Administered 2023-05-07: 40 mL

## 2023-05-07 MED ORDER — HEPARIN SODIUM (PORCINE) 1000 UNIT/ML IJ SOLN
INTRAMUSCULAR | Status: DC | PRN
  Administered 2023-05-07 (×2): 2000 [IU] via INTRAVENOUS

## 2023-05-07 MED ORDER — ACETAMINOPHEN 325 MG PO TABS: 650.0000 mg | ORAL_TABLET | ORAL | Status: DC | PRN

## 2023-05-07 MED ORDER — HYDRALAZINE HCL 20 MG/ML IJ SOLN: 10.0000 mg | INTRAMUSCULAR | Status: DC | PRN

## 2023-05-07 SURGICAL SUPPLY — 14 items
CATH 5FR JL3.5 JR4 ANG PIG MP (CATHETERS) IMPLANT
CATH BALLN WEDGE 5F 110CM (CATHETERS) IMPLANT
DEVICE RAD TR BAND REGULAR (VASCULAR PRODUCTS) IMPLANT
GLIDESHEATH SLEND SS 6F .021 (SHEATH) IMPLANT
GUIDEWIRE .025 260CM (WIRE) IMPLANT
GUIDEWIRE INQWIRE 1.5J.035X260 (WIRE) IMPLANT
INQWIRE 1.5J .035X260CM (WIRE) ×1
KIT HEART LEFT (KITS) ×1 IMPLANT
PACK CARDIAC CATHETERIZATION (CUSTOM PROCEDURE TRAY) ×1 IMPLANT
SHEATH 6FR 75 DEST SLENDER (SHEATH) IMPLANT
SHEATH GLIDE SLENDER 4/5FR (SHEATH) IMPLANT
TRANSDUCER W/STOPCOCK (MISCELLANEOUS) ×1 IMPLANT
TUBING CIL FLEX 10 FLL-RA (TUBING) ×1 IMPLANT
WIRE HI TORQ VERSACORE-J 145CM (WIRE) IMPLANT

## 2023-05-07 NOTE — H&P (Signed)
OV 04/26/2023 copied for documentation     Patient referred by Daisy Floro, MD for murmur  Subjective:   Krystal Chambers, female    DOB: 18-Aug-1935, 88 y.o.   MRN: [id number]   Chief Complaint  Patient presents with   Results   Heart Murmur     HPI  88 y.o. African-American female with hypertension, hyperlipidemia, severe aortic stenosis.  Reviewed recent test results with the patient, details below. Patient's physical activity is limited due to arthritis and knee and hip pain. However, she des report exertional dyspnea with minimal activiy, along with stable leg swelling. She denie schest pain, presyncope, syncope history.   Initial consultation visit 03/2023: Patient is here with her daughter.  She is fairly sedentary lifestyle.  With her limited level of activity, mostly due to arthritis, she has minimal exertional dyspnea.  She denies any presyncope or syncope.  She has mild leg edema.  It appears that she is on metoprolol and amlodipine.  While it is likely that she was also taking either one of them, she has been taking both these medications.  However, blood pressure still remains elevated.   Current Outpatient Medications:    acetaminophen (TYLENOL) 325 MG tablet, Take 2 tablets (650 mg total) by mouth every 6 (six) hours as needed for mild pain (or Fever >/= 101)., Disp: , Rfl:    amLODipine (NORVASC) 10 MG tablet, Take 10 mg by mouth daily., Disp: , Rfl:    carvedilol (COREG) 6.25 MG tablet, Take 6.25 mg by mouth 2 (two) times daily with a meal., Disp: , Rfl:    Cholecalciferol (VITAMIN D3) 50 MCG (2000 UT) capsule, Take 2,000 Units by mouth daily., Disp: , Rfl:    CRESTOR 20 MG tablet, Take 20 mg by mouth daily., Disp: , Rfl:    hydrochlorothiazide (HYDRODIURIL) 25 MG tablet, Take 25 mg by mouth every morning., Disp: , Rfl:    lisinopril (ZESTRIL) 40 MG tablet, Take 40 mg by mouth daily., Disp: , Rfl:    Multiple Vitamin (MULTIVITAMIN) capsule, Take 1 capsule by  mouth daily., Disp: , Rfl:    verapamil (COVERA HS) 240 MG (CO) 24 hr tablet, Take 240 mg by mouth 2 (two) times daily. 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   Left Carotid: The extracranial vessels were near-normal with only minimal wall thickening or plaque.   Vertebrals:  Bilateral vertebral arteries demonstrate antegrade flow. Subclavians: Normal flow hemodynamics were seen in bilateral subclavian arteries.  Echocardiogram 04/18/2023:  1. Left ventricular ejection fraction, by estimation, is 60 to 65%. The left ventricle has normal function. The left ventricle has no regional wall motion abnormalities. There is severe left ventricular hypertrophy. Left ventricular diastolic parameters  are consistent with Grade I diastolic dysfunction (impaired relaxation).  2. Right ventricular systolic function is normal. The right ventricular size is normal. There is normal pulmonary artery systolic pressure.  3. Left atrial size was mildly dilated.  4. The mitral valve is abnormal. Mild mitral valve regurgitation. No evidence of mitral stenosis.  5. The aortic valve is tricuspid. There is severe calcifcation of the aortic valve. There is severe thickening of the aortic valve. Aortic valve regurgitation is mild. Severe aortic valve  stenosis.  V-max 4.2 m/s, mean pressure gradient 44 mmHg, AVA 0.9 cm by continuity question.  6. The inferior vena cava is normal in size with greater than 50% respiratory variability, suggesting right atrial pressure of 3 mmHg.  Recent labs: 03/06/2023: Glucose 122, BUN/Cr 23/1.3. EGFR 40. K 4.2. Hb 12.6. Chol 209, TG 166, HDL 51, LDL 128   Review of Systems   Cardiovascular:  Positive for dyspnea on exertion and leg swelling. Negative for chest pain, palpitations and syncope.  Musculoskeletal:  Positive for arthritis.         Vitals:   04/26/23 1050  BP: (!) 168/78  Pulse: 72  Resp: 16  SpO2: 94%     Body mass index is 31.98 kg/m. Filed Weights   04/26/23 1050  Weight: 153 lb (69.4 kg)     Objective:   Physical Exam Vitals and nursing note reviewed.  Constitutional:      General: She is not in acute distress. Neck:     Vascular: No JVD.  Cardiovascular:     Rate and Rhythm: Normal rate and regular rhythm.     Heart sounds: Murmur heard.     Harsh midsystolic murmur is present with a grade of 3/6 at the upper right sternal border radiating to the neck.  Pulmonary:     Effort: Pulmonary effort is normal.     Breath sounds: Normal breath sounds. No wheezing or rales.  Musculoskeletal:     Right lower leg: No edema.     Left lower leg: No edema.          Visit diagnoses:   ICD-10-CM   1. Severe aortic stenosis  I35.0 CBC    Basic metabolic panel    Ambulatory referral to [facility] (only at [facility])    2. Essential hypertension  I10        Orders Placed This Encounter  Procedures   CBC   Basic metabolic panel   Ambulatory referral to [facility] Heart[facility]Valve Clinic (only at [facility])     Assessment & Recommendations:   88 y.o. African-American female with hypertension, hyperlipidemia, murmur  Severe aortic stenosis: Severe aortic valve stenosis (stage D). Normal EF, proBNP. However, she does have leg edema, mild exertional dyspnea NYHA class II. Dyspnea could be underestimated given her low physical activity level due to arthritis. Started lasix 40 mg daily. Discussed the rationale of aortic valve replacement for prognostic benefit. Placed referral to TAVR clinic. I will proceed with right heart cath, coronary angiography. Discussed risks, benefits, alternate options in details,  especially CIN risk given her GFR 40. Will provide gentle hydration before cath.  Hypertension: Uncontrolled. I would avoid aggressive treatment until after valve replacement to avoid hypotension in the setting of severe aortic stenosis. Hopefully lasix will help some.  Mixed hyperlipidemia: Continue Crestor 20 mg daily.  Thank you for referring the patient to [place]. Please feel free to contact with any questions.   Elder Negus, MD Pager: [phone] Office: [phone]

## 2023-05-07 NOTE — Discharge Instructions (Signed)

## 2023-05-07 NOTE — Interval H&P Note (Signed)
History and Physical Interval Note:  05/07/2023 9:50 AM  Krystal Chambers Severe  has presented today for surgery, with the diagnosis of aortic stenosis.  The various methods of treatment have been discussed with the patient and family. After consideration of risks, benefits and other options for treatment, the patient has consented to  Procedure(s): RIGHT/LEFT HEART CATH AND CORONARY ANGIOGRAPHY (N/A) as a surgical intervention.  The patient's history has been reviewed, patient examined, no change in status, stable for surgery.  I have reviewed the patient's chart and labs.  Questions were answered to the patient's satisfaction.    2012 Appropriate Use Criteria for Diagnostic Catheterization Valvular Disease (Right and Left Heart Catheterization or Right Heart Catheterization Alone With or Without Left Ventriculography and Coronary Angiography) Indication:  Preoperative assessment before valvular surgery A (7) Indication: 70; Score 7   Zadyn Yardley J Jaiyah Beining

## 2023-05-08 ENCOUNTER — Encounter (HOSPITAL_COMMUNITY): Payer: Self-pay | Admitting: Cardiology

## 2023-05-13 ENCOUNTER — Ambulatory Visit (HOSPITAL_COMMUNITY)
Admission: RE | Admit: 2023-05-13 | Discharge: 2023-05-13 | Disposition: A | Discharge status: Home / Self Care | Payer: Medicare Other | Source: Ambulatory Visit | Attending: Family Medicine | Admitting: Family Medicine

## 2023-05-13 ENCOUNTER — Encounter: Payer: Self-pay | Admitting: Physician Assistant

## 2023-05-13 DIAGNOSIS — I350 Nonrheumatic aortic (valve) stenosis: Secondary | ICD-10-CM

## 2023-05-13 MED ORDER — IOHEXOL 350 MG/ML SOLN
95.0000 mL | Freq: Once | INTRAVENOUS | Status: AC | PRN
  Administered 2023-05-13: 95 mL via INTRAVENOUS

## 2023-05-20 ENCOUNTER — Encounter: Payer: Self-pay | Admitting: Cardiology

## 2023-05-20 ENCOUNTER — Ambulatory Visit: Payer: Medicare Other | Admitting: Cardiology

## 2023-05-20 VITALS — BP 145/80 | HR 77 | Ht <= 58 in | Wt 154.0 lb

## 2023-05-20 DIAGNOSIS — I10 Essential (primary) hypertension: Secondary | ICD-10-CM

## 2023-05-20 DIAGNOSIS — I350 Nonrheumatic aortic (valve) stenosis: Secondary | ICD-10-CM

## 2023-05-20 MED ORDER — FUROSEMIDE 40 MG PO TABS: 20.0000 mg | ORAL_TABLET | ORAL | 0 refills | Status: DC | PRN

## 2023-05-20 NOTE — Progress Notes (Signed)
Patient referred by Daisy Floro, MD for murmur  Subjective:   Krystal Chambers, female    DOB: 06-15-1935, 88 y.o.   MRN: [id number]   Chief Complaint  Patient presents with   Aortic Stenosis   Follow-up   Hospitalization Follow-up     HPI  88 y.o. African-American female with hypertension, hyperlipidemia, severe aortic stenosis.  Patient is currently undergoing TAVR workup.  She has noticed that her blood pressure has been much lower, as was SBP 106 mmHg, since starting Lasix 40 mg daily.  She denies any presyncope or syncope symptoms.  Initial consultation visit 03/2023: Patient is here with her daughter.  She is fairly sedentary lifestyle.  With her limited level of activity, mostly due to arthritis, she has minimal exertional dyspnea.  She denies any presyncope or syncope.  She has mild leg edema.  It appears that she is on metoprolol and amlodipine.  While it is likely that she was also taking either one of them, she has been taking both these medications.  However, blood pressure still remains elevated.   Current Outpatient Medications:    acetaminophen (TYLENOL) 325 MG tablet, Take 2 tablets (650 mg total) by mouth every 6 (six) hours as needed for mild pain (or Fever >/= 101)., Disp: , Rfl:    amLODipine (NORVASC) 10 MG tablet, Take 10 mg by mouth in the morning., Disp: , Rfl:    carvedilol (COREG) 6.25 MG tablet, Take 6.25 mg by mouth 2 (two) times daily with a meal., Disp: , Rfl:    Cholecalciferol (VITAMIN D3) 50 MCG (2000 UT) capsule, Take 2,000 Units by mouth in the morning., Disp: , Rfl:    CRESTOR 20 MG tablet, Take 20 mg by mouth in the morning., Disp: , Rfl:    furosemide (LASIX) 40 MG tablet, Take 1 tablet (40 mg total) by mouth daily., Disp: 30 tablet, Rfl: 3   hydrochlorothiazide (HYDRODIURIL) 25 MG tablet, Take 25 mg by mouth every morning., Disp: , Rfl:    lisinopril (ZESTRIL) 40 MG tablet, Take 40 mg by mouth in the morning., Disp: , Rfl:    Multiple  Vitamin (MULTIVITAMIN) capsule, Take 1 capsule by mouth in the morning., Disp: , Rfl:    Cardiovascular and other pertinent studies:  Reviewed external labs and tests, independently interpreted  EKG 04/01/2023: 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   Left Carotid: The extracranial vessels were near-normal with only minimal wall thickening or plaque.   Vertebrals:  Bilateral vertebral arteries demonstrate antegrade flow. Subclavians: Normal flow hemodynamics were seen in bilateral subclavian arteries.  Echocardiogram 04/18/2023:  1. Left ventricular ejection fraction, by estimation, is 60 to 65%. The left ventricle has normal function. The left ventricle has no regional wall motion abnormalities. There is severe left ventricular hypertrophy. Left ventricular diastolic parameters  are consistent with Grade I diastolic dysfunction (impaired relaxation).  2. Right ventricular systolic function is normal. The right ventricular size is normal. There is normal pulmonary  artery systolic pressure.  3. Left atrial size was mildly dilated.  4. The mitral valve is abnormal. Mild mitral valve regurgitation. No evidence of mitral stenosis.  5. The aortic valve is tricuspid. There is severe calcifcation of the aortic valve. There is severe thickening of the aortic valve. Aortic valve regurgitation is mild. Severe aortic valve stenosis.  V-max 4.2 m/s, mean pressure  gradient 44 mmHg, AVA 0.9 cm by continuity question.  6. The inferior vena cava is normal in size with greater than 50% respiratory variability, suggesting right atrial pressure of 3 mmHg.  Recent labs: 03/06/2023: Glucose 122, BUN/Cr 23/1.3. EGFR 40. K 4.2. Hb 12.6. Chol 209, TG 166, HDL 51, LDL 128   Review of Systems  Cardiovascular:  Positive for dyspnea on exertion and leg swelling. Negative for chest pain, palpitations and syncope.  Musculoskeletal:  Positive for arthritis.         Vitals:   05/20/23 1214  BP: (!) 145/80  Pulse: 77  SpO2: 97%     Body mass index is 32.19 kg/m. Filed Weights   05/20/23 1214  Weight: 154 lb (69.9 kg)     Objective:   Physical Exam Vitals and nursing note reviewed.  Constitutional:      General: She is not in acute distress. Neck:     Vascular: No JVD.  Cardiovascular:     Rate and Rhythm: Normal rate and regular rhythm.     Heart sounds: Murmur heard.     Harsh midsystolic murmur is present with a grade of 3/6 at the upper right sternal border radiating to the neck.  Pulmonary:     Effort: Pulmonary effort is normal.     Breath sounds: Normal breath sounds. No wheezing or rales.  Musculoskeletal:     Right lower leg: No edema.     Left lower leg: Edema (1+) present.          Visit diagnoses:   ICD-10-CM   1. Severe aortic stenosis  I35.0     2. Essential hypertension  I10        Assessment & Recommendations:   88 y.o. African-American female with hypertension, hyperlipidemia, murmur  Severe aortic stenosis: Severe aortic valve stenosis (stage D). No obstructive CAD. Continue TAVR workup. Reduce Lasix to 20 mg as needed, since she has had episodes of relatively low blood pressure since starting Lasix 40 mg daily.  Hypertension: Reasonably controlled. I would avoid aggressive treatment until after valve replacement to avoid hypotension in the setting of severe aortic stenosis. Hopefully lasix will help  some.  Mixed hyperlipidemia: Continue Crestor 20 mg daily.  F/u in 09/2023    Elder Negus, MD Pager: [phone] Office: [phone]

## 2023-06-05 ENCOUNTER — Encounter: Payer: Medicare Other | Admitting: Surgery

## 2023-06-12 ENCOUNTER — Encounter: Payer: Self-pay | Admitting: Surgery

## 2023-06-12 ENCOUNTER — Institutional Professional Consult (permissible substitution): Payer: Medicare Other | Admitting: Surgery

## 2023-06-12 VITALS — BP 114/69 | HR 71 | Resp 20 | Ht <= 58 in | Wt 150.0 lb

## 2023-06-12 DIAGNOSIS — I350 Nonrheumatic aortic (valve) stenosis: Secondary | ICD-10-CM | POA: Diagnosis not present

## 2023-06-12 NOTE — Progress Notes (Signed)
Patient ID: Krystal Chambers, female   DOB: [birth year], 88 y.o.   MRN: [id number]  [facility]       [address]       [place] [address]             [phone]          CARDIOTHORACIC SURGERY CONSULTATION REPORT  PCP is Daisy Floro, MD Referring Provider is Verne Carrow, MD Primary Cardiologist is Truett Mainland, MD  Reason for consultation:  Severe aortic stenosis  HPI:  The patient is an 88 year old woman with a history of hypertension, hyperlipidemia, severe degenerative arthritis in her left knee, and breast cancer status post right mastectomy who was referred for consideration of TAVR.  She was referred to Dr. Rosemary Holms for evaluation of a heart murmur and reported shortness of breath on exertion.  A 2D echocardiogram on 04/18/2023 showed a mean gradient across aortic valve of 51 mmHg with a valve area of 0.9 cm consistent with severe aortic stenosis.  Left ventricular ejection fraction was 60 to 65%.  She underwent cardiac catheterization on 05/07/2023 showing no obstructive coronary disease with normal filling pressures.  She is here today with her daughter.  She is a retired Agricultural engineer.  She reports shortness of breath and fatigue with mild exertion.  She has had lower extremity edema that has improved with Lasix.  She denies any chest discomfort.  She denies any dizziness or syncope.  She is not very active due to arthritis in her left knee.  Past Medical History:  Diagnosis Date   Arthritis    "left knee" (07/31/2017)   Cancer of right breast (HCC) 1997   Hypertension    Severe aortic stenosis    Thyroid disease     Past Surgical History:  Procedure Laterality Date   ABDOMINAL HYSTERECTOMY     pt unsure if she's had a hysterectomy on 07/31/2017   APPENDECTOMY     BREAST BIOPSY Right 1997 X 2   CATARACT EXTRACTION W/ INTRAOCULAR LENS IMPLANT Right ~ 2015   CHOLECYSTECTOMY  OPEN     FRACTURE SURGERY     INGUINAL HERNIA REPAIR Right    "not sure if it was a hernia or cyst; felt like sand; had OR; scar still there" (07/31/2017)   INTRAMEDULLARY (IM) NAIL INTERTROCHANTERIC Left 07/30/2017   INTRAMEDULLARY (IM) NAIL INTERTROCHANTERIC Left 07/30/2017   Procedure: INTRAMEDULLARY (IM) NAIL INTERTROCHANTRIC;  Surgeon: Toni Arthurs, MD;  Location: [facility] OR;  Service: Orthopedics;  Laterality: Left;   MASTECTOMY Right 06/18/96   RIGHT HEART CATH AND CORONARY ANGIOGRAPHY N/A 05/07/2023   Procedure: RIGHT HEART CATH AND CORONARY ANGIOGRAPHY;  Surgeon: Elder Negus, MD;  Location: [facility] INVASIVE CV LAB;  Service: Cardiovascular;  Laterality: N/A;   TONSILLECTOMY AND ADENOIDECTOMY      Family History  Problem Relation Age of Onset   Heart disease Mother    Heart disease Father     Social History   Socioeconomic History   Marital status: Widowed    Spouse name: Not on file   Number of children: 3   Years of education: Not on file   Highest education level: Not on file  Occupational History   Occupation: Retired-nursing Geophysicist/field seismologist  Tobacco Use   Smoking status: Never   Smokeless tobacco: Never  Vaping Use   Vaping Use: Never used  Substance and Sexual Activity   Alcohol use: No   Drug use:  No   Sexual activity: Never  Other Topics Concern   Not on file  Social History Narrative   Not on file   Social Determinants of Health   Financial Resource Strain: Not on file  Food Insecurity: Not on file  Transportation Needs: Not on file  Physical Activity: Not on file  Stress: Not on file  Social Connections: Not on file  Intimate Partner Violence: Not on file    Prior to Admission medications   Medication Sig Start Date End Date Taking? Authorizing Provider  acetaminophen (TYLENOL) 325 MG tablet Take 2 tablets (650 mg total) by mouth every 6 (six) hours as needed for mild pain (or Fever >/= 101). 08/01/17  Yes Johnson, Clanford L, MD  amLODipine (NORVASC) 10  MG tablet Take 10 mg by mouth in the morning. 03/11/23  Yes [provider]  carvedilol (COREG) 6.25 MG tablet Take 6.25 mg by mouth 2 (two) times daily with a meal.   Yes [provider]  Cholecalciferol (VITAMIN D3) 50 MCG (2000 UT) capsule Take 2,000 Units by mouth in the morning.   Yes [provider]  CRESTOR 20 MG tablet Take 20 mg by mouth in the morning. 02/23/21  Yes [provider]  furosemide (LASIX) 40 MG tablet Take 0.5 tablets (20 mg total) by mouth as needed. 05/20/23 08/18/23 Yes Patwardhan, Manish J, MD  hydrochlorothiazide (HYDRODIURIL) 25 MG tablet Take 25 mg by mouth every morning.   Yes [provider]  lisinopril (ZESTRIL) 40 MG tablet Take 40 mg by mouth in the morning.   Yes [provider]  Multiple Vitamin (MULTIVITAMIN) capsule Take 1 capsule by mouth in the morning.   Yes [provider]    Current Outpatient Medications  Medication Sig Dispense Refill   acetaminophen (TYLENOL) 325 MG tablet Take 2 tablets (650 mg total) by mouth every 6 (six) hours as needed for mild pain (or Fever >/= 101).     amLODipine (NORVASC) 10 MG tablet Take 10 mg by mouth in the morning.     carvedilol (COREG) 6.25 MG tablet Take 6.25 mg by mouth 2 (two) times daily with a meal.     Cholecalciferol (VITAMIN D3) 50 MCG (2000 UT) capsule Take 2,000 Units by mouth in the morning.     CRESTOR 20 MG tablet Take 20 mg by mouth in the morning.     furosemide (LASIX) 40 MG tablet Take 0.5 tablets (20 mg total) by mouth as needed. 1 tablet 0   hydrochlorothiazide (HYDRODIURIL) 25 MG tablet Take 25 mg by mouth every morning.     lisinopril (ZESTRIL) 40 MG tablet Take 40 mg by mouth in the morning.     Multiple Vitamin (MULTIVITAMIN) capsule Take 1 capsule by mouth in the morning.     No current facility-administered medications for this visit.    No Known Allergies    Review of Systems:   General:  normal appetite, + decreased  energy, no weight gain, no weight loss, no fever  Cardiac:  no chest pain with exertion, no chest pain at rest, +SOB with mild exertion, no resting SOB, no PND, no orthopnea, + palpitations, no arrhythmia, no atrial fibrillation, + LE edema, no dizzy spells, no syncope  Respiratory:  + exertional shortness of breath, no home oxygen, no productive cough, no dry cough, no bronchitis, no wheezing, no hemoptysis, no asthma, no pain with inspiration or cough, no sleep apnea, no CPAP at night  GI:   no difficulty  swallowing, no reflux, no frequent heartburn, no hiatal hernia, no abdominal pain, no constipation, no diarrhea, no hematochezia, no hematemesis, no melena  GU:   no dysuria,  no frequency, no urinary tract infection, no hematuria, no kidney stones, no kidney disease  Vascular:  no pain suggestive of claudication, no pain in feet, no leg cramps, no varicose veins, no DVT, no non-healing foot ulcer  Neuro:   no stroke, no TIA's, no seizures, no headaches, no temporary blindness one eye,  no slurred speech, no peripheral neuropathy, no chronic pain, + instability of gait, no memory/cognitive dysfunction  Musculoskeletal: + arthritis - primarily involving the left knee, + joint swelling, no myalgias, + difficulty walking, + decreased mobility   Skin:   no rash, no itching, no skin infections, no pressure sores or ulcerations  Psych:   no anxiety, no depression, no nervousness, no unusual recent stress  Eyes:   no blurry vision, no floaters, no recent vision changes, + wears glasses   ENT:   no hearing loss, no loose or painful teeth, + dentures  Hematologic:  no easy bruising, no abnormal bleeding, no clotting disorder, no frequent epistaxis  Endocrine:  no diabetes, does not check CBG's at home     Physical Exam:   BP 114/69 (BP Location: Right Arm, Patient Position: Sitting)   Pulse 71   Resp 20   Ht 4\' 10"  (1.473 m)   Wt 150 lb (68 kg)   SpO2 92% Comment: RA  BMI 31.35 kg/m    General:  Obese woman,   well-appearing  HEENT:  Unremarkable, NCAT, PERLA, EOMI  Neck:   no JVD, no bruits, no adenopathy   Chest:   clear to auscultation, symmetrical breath sounds, no wheezes, no rhonchi   CV:   RRR, 3/6 systolic murmur RSB, No diastolic murmur  Abdomen:  soft, non-tender, no masses   Extremities:  warm, well-perfused, pulses palpable at ankle bilaterally, no lower extremity edema  Rectal/GU  Deferred  Neuro:   Grossly non-focal and symmetrical throughout  Skin:   Clean and dry, no rashes, no breakdown  Diagnostic Tests:  ECHOCARDIOGRAM REPORT       Patient Name:   Krystal Chambers Date of Exam: 04/18/2023  Medical Rec #:  [id number]     Height:       58.0 in  Accession #:    [id number]    Weight:       153.0 lb  Date of Birth:  1935-06-09    BSA:          1.625 m  Patient Age:    87 years      BP:           163/93 mmHg  Patient Gender: F             HR:           68 bpm.  Exam Location:  Outpatient   Procedure: 2D Echo, Color Doppler and Cardiac Doppler   Indications:    R01.0 Heart Murmur    History:        Patient has no prior history of Echocardiogram  examinations.                 Risk Factors:Hypertension and R Breast CA/Mastectomy.    Sonographer:    L. Thornton-Maynard  Referring Phys: [id number] [facility] J PATWARDHAN   IMPRESSIONS     1. Left ventricular ejection fraction, by estimation, is 60 to 65%. The  left  ventricle has normal function. The left ventricle has no regional  wall motion abnormalities. There is severe left ventricular hypertrophy.  Left ventricular diastolic parameters   are consistent with Grade I diastolic dysfunction (impaired relaxation).   2. Right ventricular systolic function is normal. The right ventricular  size is normal. There is normal pulmonary artery systolic pressure.   3. Left atrial size was mildly dilated.   4. The mitral valve is abnormal. Mild mitral valve regurgitation. No  evidence of mitral stenosis.   5.  The aortic valve is tricuspid. There is severe calcifcation of the  aortic valve. There is severe thickening of the aortic valve. Aortic valve  regurgitation is mild. Severe aortic valve stenosis.   6. The inferior vena cava is normal in size with greater than 50%  respiratory variability, suggesting right atrial pressure of 3 mmHg.   FINDINGS   Left Ventricle: Left ventricular ejection fraction, by estimation, is 60  to 65%. The left ventricle has normal function. The left ventricle has no  regional wall motion abnormalities. The left ventricular internal cavity  size was normal in size. There is   severe left ventricular hypertrophy. Left ventricular diastolic  parameters are consistent with Grade I diastolic dysfunction (impaired  relaxation).   Right Ventricle: The right ventricular size is normal. No increase in  right ventricular wall thickness. Right ventricular systolic function is  normal. There is normal pulmonary artery systolic pressure. The tricuspid  regurgitant velocity is 2.37 m/s, and   with an assumed right atrial pressure of 3 mmHg, the estimated right  ventricular systolic pressure is 25.5 mmHg.   Left Atrium: Left atrial size was mildly dilated.   Right Atrium: Right atrial size was normal in size.   Pericardium: There is no evidence of pericardial effusion.   Mitral Valve: The mitral valve is abnormal. There is mild thickening of  the mitral valve leaflet(s). There is mild calcification of the mitral  valve leaflet(s). Mild mitral annular calcification. Mild mitral valve  regurgitation. No evidence of mitral  valve stenosis.   Tricuspid Valve: The tricuspid valve is normal in structure. Tricuspid  valve regurgitation is mild . No evidence of tricuspid stenosis.   Aortic Valve: The aortic valve is tricuspid. There is severe calcifcation  of the aortic valve. There is severe thickening of the aortic valve.  Aortic valve regurgitation is mild. Aortic  regurgitation PHT measures 595  msec. Severe aortic stenosis is  present. Aortic valve mean gradient measures 51.0 mmHg. Aortic valve peak  gradient measures 79.7 mmHg. Aortic valve area, by VTI measures 0.89 cm.   Pulmonic Valve: The pulmonic valve was normal in structure. Pulmonic valve  regurgitation is not visualized. No evidence of pulmonic stenosis.   Aorta: The aortic root is normal in size and structure.   Venous: The inferior vena cava is normal in size with greater than 50%  respiratory variability, suggesting right atrial pressure of 3 mmHg.   IAS/Shunts: No atrial level shunt detected by color flow Doppler.     LEFT VENTRICLE  PLAX 2D  LVIDd:         3.90 cm     Diastology  LVIDs:         2.40 cm     LV e' medial:    5.22 cm/s  LV PW:         1.30 cm     LV E/e' medial:  9.8  LV IVS:        1.70  cm     LV e' lateral:   5.44 cm/s  LVOT diam:     2.00 cm     LV E/e' lateral: 9.4  LV SV:         95  LV SV Index:   58  LVOT Area:     3.14 cm    LV Volumes (MOD)  LV vol d, MOD A2C: 52.0 ml  LV vol d, MOD A4C: 47.8 ml  LV vol s, MOD A2C: 9.5 ml  LV vol s, MOD A4C: 12.7 ml  LV SV MOD A2C:     42.5 ml  LV SV MOD A4C:     47.8 ml  LV SV MOD BP:      39.7 ml   RIGHT VENTRICLE  RV Basal diam:  2.80 cm  RV S prime:     9.57 cm/s  TAPSE (M-mode): 2.0 cm   LEFT ATRIUM             Index        RIGHT ATRIUM          Index  LA diam:        3.70 cm 2.28 cm/m   RA Area:     9.09 cm  LA Vol (A2C):   42.9 ml 26.40 ml/m  RA Volume:   16.20 ml 9.97 ml/m  LA Vol (A4C):   39.8 ml 24.49 ml/m  LA Biplane Vol: 43.9 ml 27.01 ml/m   AORTIC VALVE                     PULMONIC VALVE  AV Area (Vmax):    0.98 cm      PV Vmax:          0.99 m/s  AV Area (Vmean):   0.93 cm      PV Peak grad:     3.9 mmHg  AV Area (VTI):     0.89 cm      PR End Diast Vel: 6.10 msec  AV Vmax:           446.33 cm/s  AV Vmean:          340.333 cm/s  AV VTI:            1.063 m  AV Peak Grad:       79.7 mmHg  AV Mean Grad:      51.0 mmHg  LVOT Vmax:         139.00 cm/s  LVOT Vmean:        101.000 cm/s  LVOT VTI:          0.302 m  LVOT/AV VTI ratio: 0.28  AI PHT:            595 msec    AORTA  Ao Root diam: 2.80 cm  Ao Asc diam:  3.20 cm   MITRAL VALVE                TRICUSPID VALVE  MV Area (PHT): 2.32 cm     TR Peak grad:   22.5 mmHg  MV Decel Time: 327 msec     TR Vmax:        237.00 cm/s  MV E velocity: 51.40 cm/s  MV A velocity: 116.00 cm/s  SHUNTS  MV E/A ratio:  0.44         Systemic VTI:  0.30 m  Systemic Diam: 2.00 cm   Charlton Haws MD  Electronically signed by Charlton Haws MD  Signature Date/Time: 04/18/2023/10:02:38 AM        Final      Physicians  Panel Physicians Referring Physician Case Authorizing Physician  Patwardhan, Anabel Bene, MD (Primary)     Procedures  RIGHT HEART CATH AND CORONARY ANGIOGRAPHY   Conclusion  LM: Normal LAD: 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 No shock present.   Procedural Details  Technical Details Procedures: 1. Ultrasound guided right radial artery and right AC veinous access 2. Right heart catheterization 3. Selective left and right coronary angiography 4. Conscious sedation monitoring 52 min  Indication: Severe aortic stenosis  History: 88 y.o. African-American female with hypertension, hyperlipidemia, severe aortic stenosis.   Due to tortuosity in the radial artery, as well as right  subclavian artery, 6 French destination sheath was used.  Diagnostic Angiography: Catheter/s advances over guidewire under fluoroscopy Left coronary artery: 5 Fr JL 3.5  Right coronary artery: 5 Fr JR 4 Left heart catheterization: Not performed   Pressures tracings obtained in right atrium, right ventricle, pulmonary artery, and pulmonary capillary wedge position. Invasive hemodynamic measurements performed using Fick method.     Anticoagulation:  4000 units heparin  Hemostasis: TR band  Total contrast used: 8.8 cc   Total fluoro time: 8.8 min Air Kerma: 195 mGy  Conscious sedation was administered under my direct supervision. IV Versed 0.5 mg and fentanyl 12.5 mcg administered. Continuous ECG, pulse oximetry and blood pressure were monitored throughout the entire procedure. Monitoring corroborated by cath lab nurse and technician.  Total sedation time: 52 minutes.  All wires and catheters removed out of the body at the end of the procedure Final angiogram showed no dissection/perforation.   [image] Elder Negus, MD Pager: [phone] Office: [phone]          Estimated blood loss <50 mL.   During this procedure medications were administered to achieve and maintain moderate conscious sedation while the patient's heart rate, blood pressure, and oxygen saturation were continuously monitored and I was present face-to-face 100% of this time.   Medications (Filter: Administrations occurring from 0940 to 1103 on 05/07/23)  important  Continuous medications are totaled by the amount administered until 05/07/23 1103.   Heparin (Porcine) in NaCl 1000-0.9 UT/500ML-% SOLN (mL)  Total volume: 1,000 mL Date/Time Rate/Dose/Volume Action   05/07/23 1002 500 mL Given   1002 500 mL Given   midazolam (VERSED) injection (mg)  Total dose: 0.5 mg Date/Time Rate/Dose/Volume Action   05/07/23 1003 0.5 mg Given   fentaNYL (SUBLIMAZE) injection (mcg)  Total dose:  12.5 mcg Date/Time Rate/Dose/Volume Action   05/07/23 1003 12.5 mcg Given   lidocaine (PF) (XYLOCAINE) 1 % injection (mL)  Total volume: 4 mL Date/Time Rate/Dose/Volume Action   05/07/23 1009 2 mL Given   1020 2 mL Given   Radial Cocktail/Verapamil only (mL)  Total volume: 10 mL Date/Time Rate/Dose/Volume Action   05/07/23 1023 10 mL Given   heparin sodium (  porcine) injection (Units)  Total dose: 4,000 Units Date/Time Rate/Dose/Volume Action   05/07/23 1035 2,000 Units Given   1041 2,000 Units Given   Radial Cocktail/Verapamil only (mL)  Total volume: 10 mL Date/Time Rate/Dose/Volume Action   05/07/23 1037 10 mL Given   iohexol (OMNIPAQUE) 350 MG/ML injection (mL)  Total volume: 40 mL Date/Time Rate/Dose/Volume Action   05/07/23 1053 40 mL Given    Sedation Time  Sedation Time Physician-1: 52 minutes 6 seconds Contrast     Administrations occurring from 0940 to 1103 on 05/07/23:  Medication Name Total Dose  iohexol (OMNIPAQUE) 350 MG/ML injection 40 mL   Radiation/Fluoro  Fluoro time: 8.8 (min) DAP: 13848 (mGycm2) Cumulative Air Kerma: 195 (mGy) Complications  Complications documented before study signed (05/08/2023  7:34 AM)   No complications were associated with this study.  Documented by Loma Messing B - 05/07/2023 10:50 AM     Coronary Findings  Diagnostic Dominance: Right Left Main  Vessel is normal in caliber. Vessel is angiographically normal.    Left Anterior Descending  Aneurysmal prox-mid LAD without significant stenosis    Left Circumflex  Vessel is normal in caliber. Vessel is angiographically normal.    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   Right Heart  Right Heart Pressures RA: 2 mmHg RV: 28/6 mmHg PA: 27/13 mmHg, mPAP 19 mmHg PCW: 12 mmHg  CO: 6 L/min CI: 3.7 L/min/m2   Coronary Diagrams  Diagnostic Dominance:  Right  Intervention   Implants   No implant documentation for this case.   Syngo Images   Show images for CARDIAC CATHETERIZATION Images on Long Term Storage   Show images for Wilms, ALEYNAH ROCCHIO to Procedure Log  Procedure Log   Link to Procedure Log  Procedure Log    Hemo Data  Flowsheet Row Most Recent Value  Fick Cardiac Output 6.04 L/min  Fick Cardiac Output Index 3.73 (L/min)/BSA  RA A Wave 7 mmHg  RA V Wave 4 mmHg  RA Mean 2 mmHg  RV Systolic Pressure 28 mmHg  RV Diastolic Pressure 6 mmHg  RV EDP 6 mmHg  PA Systolic Pressure 27 mmHg  PA Diastolic Pressure 13 mmHg  PA Mean 19 mmHg  PW A Wave 13 mmHg  PW V Wave 12 mmHg  PW Mean 12 mmHg  AO Systolic Pressure 139 mmHg  AO Diastolic Pressure 66 mmHg  AO Mean 97 mmHg  QP/QS 1  TPVR Index 5.1 HRUI  TSVR Index 26.04 HRUI  PVR SVR Ratio 0.07  TPVR/TSVR Ratio 0.2    ADDENDUM REPORT: 05/13/2023 18:32   CLINICAL DATA:  Aortic Valve pathology with assessment for TAVR   EXAM: Cardiac TAVR CT   TECHNIQUE: The patient was scanned on a Siemens Force 192 slice scanner. A 120 kV retrospective scan was triggered in the descending thoracic aorta at 111 HU's. Gantry rotation speed was 270 msecs and collimation was .9 mm. No beta blockade or nitro were given. The 3D data set was reconstructed in 5% intervals of the R-R cycle. Systolic and diastolic phases were analyzed on a dedicated work station using MPR, MIP and VRT modes. The patient received 95 cc of contrast.   FINDINGS: Aortic Valve: Severely thickened tri-leaflet aortic valve with heavy calcification and reduced excursion the planimeter valve area is 0.925 Sq cm consistent with severe aortic stenosis   LVOT calcification: none   Annular calcification: none  Aortic Valve Calcium Score: 2058   Presence of basal septal hypertrophy: Yes   - Systolic annular measurements > diastolic measurements; see below   Perimembranous septal diameter: 5 mm    Mitral Valve: No calcifications   Aortic Annulus Measurements- 20%   Major annulus diameter: 23 mm   Minor annulus diameter:18 mm   Annular perimeter: 66 mm   Annular area: 3.30 cm2   Aortic Root Measurements   Sinotubular Junction: 25 mm   Ascending Thoracic Aorta: 31 mm   Aortic Arch: 24 mm   Descending Thoracic Aorta: 27 mm   Aortic atherosclerosis.   Sinus of Valsalva Measurements:   Right coronary cusp width: 29 mm   Left coronary cusp width: 28 mm   Non coronary cusp width: 28 mm   Coronary Artery Height above Annulus:   Left Main: 11 mm   Left SoV height: 16 mm   Right Coronary: 16 mm   Right SoV height: 17 mm   Optimum Fluoroscopic Angle for Delivery: LAO 4, CAU 3   Cusp overlay view angle: RAO 9, CAU 9   Valves for structural team consideration: Sinus dimensions supportive of a 26 mm Evolut Valve   At the overlap between a 20 mm and 23 mm Sapien Valve   Non TAVR Valve Findings:   Coronary Arteries: Normal coronary origin. Study not completed with nitroglycerin.   Coronary Calcium Score:   Left main: 51   Left anterior descending artery: 21   Left circumflex artery: 14   Right coronary artery: 4   Total: 90   Systemic veins: Normal anatomy   Main Pulmonary artery: Normal caliber   Pulmonary veins: Normal anatomy   Left atrial appendage: Patent   Interatrial septum: No clear communications.   Left ventricle: Severe septal hypertrophy (18 mm). No systolic anterior motion of the anterior mitral valve leaflet.   Left atrium: Normal size   Right ventricle: Normal size   Right atrium: Normal size   Pericardium: No calcifications   Extra Cardiac Findings as per separate reporting.   IMPRESSION: 1. Severe Aortic stenosis. Findings pertinent to TAVR procedure are detailed above.   RECOMMENDATIONS:   The proposed cut-off value of 1,651 AU yielded a 93 % sensitivity and 75 % specificity in grading AS severity in patients  with classical low-flow, low-gradient AS. Proposed different cut-off values to define severe AS for men and women as 2,065 AU and 1,274 AU, respectively. The joint European and American recommendations for the assessment of AS consider the aortic valve calcium score as a continuum - a very high calcium score suggests severe AS and a low calcium score suggests severe AS is unlikely.   Sunday Shams, et al. 2017 ESC/EACTS Guidelines for the management of valvular heart disease. Eur Heart J [phone]   Coronary artery calcium (CAC) score is a strong predictor of incident coronary heart disease (CHD) and provides predictive information beyond traditional risk factors. CAC scoring is reasonable to use in the decision to withhold, postpone, or initiate statin therapy in intermediate-risk or selected borderline-risk asymptomatic adults (age 21-75 years and LDL-C >=70 to <190 mg/dL) who do not have diabetes or established atherosclerotic cardiovascular disease (ASCVD).* In intermediate-risk (10-year ASCVD risk >=7.5% to <20%) adults or selected borderline-risk (10-year ASCVD risk >=5% to <7.5%) adults in whom a CAC score is measured for the purpose of making a treatment decision the following recommendations have been made:   If CAC = 0, it is reasonable to  withhold statin therapy and reassess in 5 to 10 years, as long as higher risk conditions are absent (diabetes mellitus, family history of premature CHD in first degree relatives (males <55 years; females <65 years), cigarette smoking, LDL >=190 mg/dL or other independent risk factors).   If CAC is 1 to 99, it is reasonable to initiate statin therapy for patients >=40 years of age.   If CAC is >=100 or >=75th percentile, it is reasonable to initiate statin therapy at any age.   Cardiology referral should be considered for patients with CAC scores >=400 or >=75th percentile.   *2018  AHA/ACC/AACVPR/AAPA/ABC/ACPM/ADA/AGS/APhA/ASPC/NLA/PCNA Guideline on the Management of Blood Cholesterol: A Report of the American College of Cardiology/American Heart Association Task Force on Clinical Practice Guidelines. J Am Coll Cardiol. 2019;73(24):3168-3209.   Mahesh  Chandrasekhar     Electronically Signed   By: Riley Lam M.D.   On: 05/13/2023 18:32   Addended by Christell Constant, MD on 05/13/2023  6:34 PM   Narrative & Impression  CLINICAL DATA:  Preop evaluation for aortic valve replacement.   EXAM: CT ANGIOGRAPHY CHEST, ABDOMEN AND PELVIS   TECHNIQUE: Non-contrast CT of the chest was initially obtained.   Multidetector CT imaging through the chest, abdomen and pelvis was performed using the standard protocol during bolus administration of intravenous contrast. Multiplanar reconstructed images and MIPs were obtained and reviewed to evaluate the vascular anatomy.   RADIATION DOSE REDUCTION: This exam was performed according to the departmental dose-optimization program which includes automated exposure control, adjustment of the mA and/or kV according to patient size and/or use of iterative reconstruction technique.   CONTRAST:  95mL OMNIPAQUE IOHEXOL 350 MG/ML SOLN   COMPARISON:  None Available.   FINDINGS: CTA CHEST FINDINGS   Cardiovascular: Normal heart size. No pericardial effusion. Aortic valve thickening and calcifications. Normal caliber thoracic aorta with mild atherosclerotic disease. Standard three-vessel aortic arch with significant stenosis. Left main and three-vessel coronary artery calcifications.   Mediastinum/Nodes: Small hiatal hernia. Surgical clips of the right axilla. No enlarged nodes seen in the chest.   Lungs/Pleura: Central airways are patent. No consolidation, pleural effusion or pneumothorax.   Musculoskeletal: No chest wall abnormality. No acute or significant osseous findings.   CTA ABDOMEN AND PELVIS  FINDINGS   Hepatobiliary: No focal liver abnormality is seen. Status post cholecystectomy. No biliary dilatation.   Pancreas: Cystic lesions of the pancreatic body and head, largest is a lesion of the pancreatic body measuring 1.9 cm on series 5, image 86.   Spleen: Normal in size without focal abnormality.   Adrenals/Urinary Tract: Bilateral adrenal glands are unremarkable. No hydronephrosis or nephrolithiasis. Bladder is unremarkable.   Stomach/Bowel: Stomach is within normal limits. Appendix appears normal. Diverticulosis. No evidence of bowel wall thickening, distention, or inflammatory changes.   Vascular/lymphatic: No significant atherosclerotic disease of the abdominal aorta. Branch vessels are patent. No enlarged lymph nodes seen in the abdomen or pelvis.   Reproductive: Enlarged multi fibroid uterus, some of the fibroids are calcified. No adnexal mass.   Other: No abdominal wall hernia or abnormality. No abdominopelvic ascites.   Musculoskeletal: Intramedullary rod of the left femur. Mild levocurvature of the lumbar spine. No aggressive appearing osseous lesions.   VASCULAR MEASUREMENTS PERTINENT TO TAVR:   AORTA:   Minimal Aortic Diameter-11.5 mm   Severity of Aortic Calcification-mild thoracic.   RIGHT PELVIS:   Right Common Iliac Artery -   Minimal Diameter-8.3 mm   Tortuosity-mild   Calcification-none   Right  External Iliac Artery -   Minimal Diameter-7.1 mm   Tortuosity-mild   Calcification-none   Right Common Femoral Artery -   Minimal Diameter-7.3 mm   Tortuosity-none   Calcification-none   LEFT PELVIS:   Left Common Iliac Artery -   Minimal Diameter-8.0 mm   Tortuosity-mild   Calcification-none   Left External Iliac Artery -   Minimal Diameter-6.7 mm   Tortuosity-mild   Calcification-none   Left Common Femoral Artery -   Minimal Diameter-6.9 mm   Tortuosity-mild   Calcification-none   Review of the MIP images  confirms the above findings.   IMPRESSION: 1. Vascular findings and measurements pertinent to potential TAVR procedure, as detailed above. 2. Thickening and calcification of the aortic valve, compatible with reported clinical history of aortic stenosis. 3. Mild aortic atherosclerosis. No significant atherosclerotic disease of the iliac arteries. 4. Left main and 3 vessel coronary artery disease. 5. Multiple cystic pancreatic lesions, largest is a lesion of the pancreatic body measuring 1.9 cm. Recommend follow-up with MRCP in 2 years.     Electronically Signed   By: Allegra Lai M.D.   On: 05/13/2023 14:59       Impression:  This 88 year old woman has stage D, severe, symptomatic aortic stenosis with NYHA class II symptoms of exertional fatigue and shortness of breath consistent with chronic diastolic congestive heart failure.  I have personally reviewed her 2D echocardiogram, cardiac catheterization, and CTA studies.  Her echocardiogram shows a trileaflet aortic valve with severe calcification and thickening and restricted leaflet mobility.  The mean gradient is 51 mmHg with a valve area of 0.89 cm consistent with severe aortic stenosis.  There is mild regurgitation.  Left ventricular ejection fraction is 60 to 65%.  Cardiac catheterization showed no significant coronary disease with normal filling pressures.  I agree that aortic valve replacement is indicated in this patient for relief of her symptoms and to prevent left ventricular deterioration.  Given her advanced age I think transcatheter aortic valve replacement would be the best option for treating her.  Her gated cardiac CTA shows anatomy suitable for TAVR using a 26 mm Evolut FX valve.  Her abdominal and pelvic CTA shows adequate pelvic vascular anatomy to allow transfemoral insertion.  The patient and her daughter were counseled at length regarding treatment alternatives for management of severe symptomatic aortic stenosis.  The risks and benefits of surgical intervention has been discussed in detail. Long-term prognosis with medical therapy was discussed. Alternative approaches such as conventional surgical aortic valve replacement, transcatheter aortic valve replacement, and palliative medical therapy were compared and contrasted at length. This discussion was placed in the context of the patient's own specific clinical presentation and past medical history. All of their questions have been addressed.   Following the decision to proceed with transcatheter aortic valve replacement, a discussion was held regarding what types of management strategies would be attempted intraoperatively in the event of life-threatening complications, including whether or not the patient would be considered a candidate for the use of cardiopulmonary bypass and/or conversion to open sternotomy for attempted surgical intervention.  Given her advanced age and comorbid risk factors I do not think she is a candidate for emergent sternotomy to manage any intraoperative complications.  The patient has been advised of a variety of complications that might develop including but not limited to risks of death, stroke, paravalvular leak, aortic dissection or other major vascular complications, aortic annulus rupture, device embolization, cardiac rupture or perforation, mitral regurgitation, acute myocardial infarction,  arrhythmia, heart block or bradycardia requiring permanent pacemaker placement, congestive heart failure, respiratory failure, renal failure, pneumonia, infection, other late complications related to structural valve deterioration or migration, or other complications that might ultimately cause a temporary or permanent loss of functional independence or other long term morbidity. The patient provides full informed consent for the procedure as described and all questions were answered.      Plan:  She will be scheduled for transfemoral TAVR using  a [employer] Evolut FX valve on 07/02/2023.  I spent 60 minutes performing this consultation and > 50% of this time was spent face to face counseling and coordinating the care of this patient's severe symptomatic aortic stenosis.   Alleen Borne, MD 06/12/2023 1:45 PM

## 2023-06-19 ENCOUNTER — Other Ambulatory Visit: Payer: Self-pay

## 2023-06-19 DIAGNOSIS — I350 Nonrheumatic aortic (valve) stenosis: Secondary | ICD-10-CM

## 2023-06-28 ENCOUNTER — Telehealth: Payer: Self-pay | Admitting: Cardiology

## 2023-06-28 ENCOUNTER — Other Ambulatory Visit: Payer: Self-pay

## 2023-06-28 ENCOUNTER — Ambulatory Visit (HOSPITAL_COMMUNITY)
Admission: RE | Admit: 2023-06-28 | Discharge: 2023-06-28 | Disposition: A | Discharge status: Home / Self Care | Payer: Medicare Other | Source: Ambulatory Visit | Attending: Cardiovascular Disease | Admitting: Cardiovascular Disease

## 2023-06-28 ENCOUNTER — Encounter (HOSPITAL_COMMUNITY)
Admission: RE | Admit: 2023-06-28 | Discharge: 2023-06-28 | Disposition: A | Discharge status: Home / Self Care | Payer: Medicare Other | Source: Ambulatory Visit | Attending: Cardiovascular Disease | Admitting: Cardiovascular Disease

## 2023-06-28 DIAGNOSIS — Z1152 Encounter for screening for COVID-19: Secondary | ICD-10-CM | POA: Insufficient documentation

## 2023-06-28 DIAGNOSIS — I350 Nonrheumatic aortic (valve) stenosis: Secondary | ICD-10-CM | POA: Insufficient documentation

## 2023-06-28 DIAGNOSIS — Z01818 Encounter for other preprocedural examination: Secondary | ICD-10-CM | POA: Insufficient documentation

## 2023-06-28 LAB — COMPREHENSIVE METABOLIC PANEL
ALT: 12 U/L (ref 0–44)
AST: 16 U/L (ref 15–41)
Albumin: 3.9 g/dL (ref 3.5–5.0)
Alkaline Phosphatase: 62 U/L (ref 38–126)
Anion gap: 11 (ref 5–15)
BUN: 27 mg/dL — ABNORMAL HIGH (ref 8–23)
CO2: 28 mmol/L (ref 22–32)
Calcium: 10.8 mg/dL — ABNORMAL HIGH (ref 8.9–10.3)
Chloride: 102 mmol/L (ref 98–111)
Creatinine, Ser: 1.51 mg/dL — ABNORMAL HIGH (ref 0.44–1.00)
GFR, Estimated: 34 mL/min — ABNORMAL LOW (ref >60)
Glucose, Bld: 171 mg/dL — ABNORMAL HIGH (ref 70–99)
Potassium: 3.4 mmol/L — ABNORMAL LOW (ref 3.5–5.1)
Sodium: 141 mmol/L (ref 135–145)
Total Bilirubin: 0.7 mg/dL (ref 0.3–1.2)
Total Protein: 7.3 g/dL (ref 6.5–8.1)

## 2023-06-28 LAB — URINALYSIS, ROUTINE W REFLEX MICROSCOPIC
Bilirubin Urine: NEGATIVE
Glucose, UA: NEGATIVE mg/dL
Hgb urine dipstick: NEGATIVE
Ketones, ur: NEGATIVE mg/dL
Nitrite: NEGATIVE
Protein, ur: NEGATIVE mg/dL
Specific Gravity, Urine: 1.01 (ref 1.005–1.030)
pH: 5 (ref 5.0–8.0)

## 2023-06-28 LAB — CBC
HCT: 37.1 % (ref 36.0–46.0)
Hemoglobin: 11.6 g/dL — ABNORMAL LOW (ref 12.0–15.0)
MCH: 27.7 pg (ref 26.0–34.0)
MCHC: 31.3 g/dL (ref 30.0–36.0)
MCV: 88.5 fL (ref 80.0–100.0)
Platelets: 237 10*3/uL (ref 150–400)
RBC: 4.19 MIL/uL (ref 3.87–5.11)
RDW: 13.6 % (ref 11.5–15.5)
WBC: 8.6 10*3/uL (ref 4.0–10.5)
nRBC: 0 % (ref 0.0–0.2)

## 2023-06-28 LAB — TYPE AND SCREEN
ABO/RH(D): O NEG
Antibody Screen: NEGATIVE

## 2023-06-28 LAB — PROTIME-INR
INR: 1.1 (ref 0.8–1.2)
Prothrombin Time: 14.6 seconds (ref 11.4–15.2)

## 2023-06-28 LAB — SURGICAL PCR SCREEN
MRSA, PCR: NEGATIVE
Staphylococcus aureus: NEGATIVE

## 2023-06-28 NOTE — Progress Notes (Signed)
Patient signed all consents at PAT lab appointment. CHG soap and instructions were given to patient. CHG surgical prep reviewed with patient and all questions answered.  Patients chart send to anesthesia for review.  

## 2023-07-01 MED ORDER — DEXMEDETOMIDINE HCL IN NACL 400 MCG/100ML IV SOLN
0.1000 ug/kg/h | INTRAVENOUS | Status: AC
  Administered 2023-07-02: .5 ug/kg/h via INTRAVENOUS
  Filled 2023-07-01: qty 100

## 2023-07-01 MED ORDER — CEFAZOLIN SODIUM-DEXTROSE 2-4 GM/100ML-% IV SOLN
2.0000 g | INTRAVENOUS | Status: AC
  Administered 2023-07-02: 2 g via INTRAVENOUS
  Filled 2023-07-01 (×2): qty 100

## 2023-07-01 MED ORDER — DEXMEDETOMIDINE HCL IN NACL 400 MCG/100ML IV SOLN: 0.1000 ug/kg/h | INTRAVENOUS | Status: DC

## 2023-07-01 MED ORDER — MAGNESIUM SULFATE 50 % IJ SOLN
40.0000 meq | INTRAMUSCULAR | Status: DC
  Filled 2023-07-01: qty 9.85

## 2023-07-01 MED ORDER — NOREPINEPHRINE 4 MG/250ML-% IV SOLN
0.0000 ug/min | INTRAVENOUS | Status: AC
  Administered 2023-07-02: 1 ug/min via INTRAVENOUS
  Filled 2023-07-01: qty 250

## 2023-07-01 MED ORDER — HEPARIN 30,000 UNITS/1000 ML (OHS) CELLSAVER SOLUTION
Status: DC
  Filled 2023-07-01: qty 1000

## 2023-07-01 MED ORDER — POTASSIUM CHLORIDE 2 MEQ/ML IV SOLN
80.0000 meq | INTRAVENOUS | Status: DC
  Filled 2023-07-01: qty 40

## 2023-07-01 MED ORDER — DEXMEDETOMIDINE BOLUS VIA INFUSION
0.1000 ug/kg | INTRAVENOUS | Status: DC
  Filled 2023-07-01 (×2): qty 48

## 2023-07-01 NOTE — H&P (Signed)
[address]       [place] [address]             [phone]      Cardiothoracic Surgery Admission History and Physical   PCP is Tenny Craw, Darlen Round, MD Referring Provider is Verne Carrow, MD Primary Cardiologist is Truett Mainland, MD   Reason for admission:  Severe aortic stenosis   HPI:   The patient is an 88 year old woman with a history of hypertension, hyperlipidemia, severe degenerative arthritis in her left knee, and breast cancer status post right mastectomy who was referred for consideration of TAVR.  She was referred to Dr. Rosemary Holms for evaluation of a heart murmur and reported shortness of breath on exertion.  A 2D echocardiogram on 04/18/2023 showed a mean gradient across aortic valve of 51 mmHg with a valve area of 0.9 cm consistent with severe aortic stenosis.  Left ventricular ejection fraction was 60 to 65%.  She underwent cardiac catheterization on 05/07/2023 showing no obstructive coronary disease with normal filling pressures.   She is a retired Agricultural engineer.  She reports shortness of breath and fatigue with mild exertion.  She has had lower extremity edema that has improved with Lasix.  She denies any chest discomfort.  She denies any dizziness or syncope.  She is not very active due to arthritis in her left knee.       Past Medical History:  Diagnosis Date   Arthritis      "left knee" (07/31/2017)   Cancer of right breast (HCC) 1997   Hypertension     Severe aortic stenosis     Thyroid disease                 Past Surgical History:  Procedure Laterality Date   ABDOMINAL HYSTERECTOMY        pt unsure if she's had a hysterectomy on 07/31/2017   APPENDECTOMY       BREAST BIOPSY Right 1997 X 2   CATARACT EXTRACTION W/ INTRAOCULAR LENS IMPLANT Right ~ 2015   CHOLECYSTECTOMY OPEN       FRACTURE SURGERY       INGUINAL HERNIA REPAIR Right      "not sure if it was a hernia or cyst; felt like sand; had OR; scar still  there" (07/31/2017)   INTRAMEDULLARY (IM) NAIL INTERTROCHANTERIC Left 07/30/2017   INTRAMEDULLARY (IM) NAIL INTERTROCHANTERIC Left 07/30/2017    Procedure: INTRAMEDULLARY (IM) NAIL INTERTROCHANTRIC;  Surgeon: Toni Arthurs, MD;  Location: [facility] OR;  Service: Orthopedics;  Laterality: Left;   MASTECTOMY Right 06/18/96   RIGHT HEART CATH AND CORONARY ANGIOGRAPHY N/A 05/07/2023    Procedure: RIGHT HEART CATH AND CORONARY ANGIOGRAPHY;  Surgeon: Elder Negus, MD;  Location: [facility] INVASIVE [facility];  Service: Cardiovascular;  Laterality: N/A;   TONSILLECTOMY AND ADENOIDECTOMY                   Family History  Problem Relation Age of Onset   Heart disease Mother     Heart disease Father            Social History         Socioeconomic History   Marital status: Widowed      Spouse name: Not on file   Number of children: 3   Years of education: Not on file   Highest education level: Not on file  Occupational History   Occupation: Retired-nursing Geophysicist/field seismologist  Tobacco Use   Smoking status:  Never   Smokeless tobacco: Never  Vaping Use   Vaping Use: Never used  Substance and Sexual Activity   Alcohol use: No   Drug use: No   Sexual activity: Never  Other Topics Concern   Not on file  Social History Narrative   Not on file    Social Determinants of Health    Financial Resource Strain: Not on file  Food Insecurity: Not on file  Transportation Needs: Not on file  Physical Activity: Not on file  Stress: Not on file  Social Connections: Not on file  Intimate Partner Violence: Not on file             Prior to Admission medications   Medication Sig Start Date End Date Taking? Authorizing Provider  acetaminophen (TYLENOL) 325 MG tablet Take 2 tablets (650 mg total) by mouth every 6 (six) hours as needed for mild pain (or Fever >/= 101). 08/01/17   Yes Johnson, Clanford L, MD  amLODipine (NORVASC) 10 MG tablet Take 10 mg by mouth in the morning. 03/11/23   Yes [provider]   carvedilol (COREG) 6.25 MG tablet Take 6.25 mg by mouth 2 (two) times daily with a meal.     Yes [provider]  Cholecalciferol (VITAMIN D3) 50 MCG (2000 UT) capsule Take 2,000 Units by mouth in the morning.     Yes [provider]  CRESTOR 20 MG tablet Take 20 mg by mouth in the morning. 02/23/21   Yes [provider]  furosemide (LASIX) 40 MG tablet Take 0.5 tablets (20 mg total) by mouth as needed. 05/20/23 08/18/23 Yes Patwardhan, Manish J, MD  hydrochlorothiazide (HYDRODIURIL) 25 MG tablet Take 25 mg by mouth every morning.     Yes [provider]  lisinopril (ZESTRIL) 40 MG tablet Take 40 mg by mouth in the morning.     Yes [provider]  Multiple Vitamin (MULTIVITAMIN) capsule Take 1 capsule by mouth in the morning.     Yes [provider]            Current Outpatient Medications  Medication Sig Dispense Refill   acetaminophen (TYLENOL) 325 MG tablet Take 2 tablets (650 mg total) by mouth every 6 (six) hours as needed for mild pain (or Fever >/= 101).       amLODipine (NORVASC) 10 MG tablet Take 10 mg by mouth in the morning.       carvedilol (COREG) 6.25 MG tablet Take 6.25 mg by mouth 2 (two) times daily with a meal.       Cholecalciferol (VITAMIN D3) 50 MCG (2000 UT) capsule Take 2,000 Units by mouth in the morning.       CRESTOR 20 MG tablet Take 20 mg by mouth in the morning.       furosemide (LASIX) 40 MG tablet Take 0.5 tablets (20 mg total) by mouth as needed. 1 tablet 0   hydrochlorothiazide (HYDRODIURIL) 25 MG tablet Take 25 mg by mouth every morning.       lisinopril (ZESTRIL) 40 MG tablet Take 40 mg by mouth in the morning.       Multiple Vitamin (MULTIVITAMIN) capsule Take 1 capsule by mouth in the morning.          No current facility-administered medications for this visit.        Allergies  No Known Allergies         Review of Systems:  General:                      normal appetite, +  decreased energy, no weight gain, no weight loss, no fever             Cardiac:                       no chest pain with exertion, no chest pain at rest, +SOB with mild exertion, no resting SOB, no PND, no orthopnea, + palpitations, no arrhythmia, no atrial fibrillation, + LE edema, no dizzy spells, no syncope             Respiratory:                 + exertional shortness of breath, no home oxygen, no productive cough, no dry cough, no bronchitis, no wheezing, no hemoptysis, no asthma, no pain with inspiration or cough, no sleep apnea, no CPAP at night             GI:                               no difficulty swallowing, no reflux, no frequent heartburn, no hiatal hernia, no abdominal pain, no constipation, no diarrhea, no hematochezia, no hematemesis, no melena             GU:                              no dysuria,  no frequency, no urinary tract infection, no hematuria, no kidney stones, no kidney disease             Vascular:                     no pain suggestive of claudication, no pain in feet, no leg cramps, no varicose veins, no DVT, no non-healing foot ulcer             Neuro:                         no stroke, no TIA's, no seizures, no headaches, no temporary blindness one eye,  no slurred speech, no peripheral neuropathy, no chronic pain, + instability of gait, no memory/cognitive dysfunction             Musculoskeletal:         + arthritis - primarily involving the left knee, + joint swelling, no myalgias, + difficulty walking, + decreased mobility              Skin:                            no rash, no itching, no skin infections, no pressure sores or ulcerations             Psych:                         no anxiety, no depression, no nervousness, no unusual recent stress             Eyes:                           no blurry vision,  no floaters, no recent vision changes, + wears glasses              ENT:                            no hearing loss, no loose or painful teeth, +  dentures             Hematologic:               no easy bruising, no abnormal bleeding, no clotting disorder, no frequent epistaxis             Endocrine:                   no diabetes, does not check CBG's at home                            Physical Exam:               BP 114/69 (BP Location: Right Arm, Patient Position: Sitting)   Pulse 71   Resp 20   Ht 4\' 10"  (1.473 m)   Wt 150 lb (68 kg)   SpO2 92% Comment: RA  BMI 31.35 kg/m              General:                      Obese woman,   well-appearing             HEENT:                       Unremarkable, NCAT, PERLA, EOMI             Neck:                           no JVD, no bruits, no adenopathy              Chest:                          clear to auscultation, symmetrical breath sounds, no wheezes, no rhonchi              CV:                              RRR, 3/6 systolic murmur RSB, No diastolic murmur             Abdomen:                    soft, non-tender, no masses              Extremities:                 warm, well-perfused, pulses palpable at ankle bilaterally, no lower extremity edema             Rectal/GU                   Deferred             Neuro:                         Grossly non-focal and symmetrical throughout  Skin:                            Clean and dry, no rashes, no breakdown   Diagnostic Tests:   ECHOCARDIOGRAM REPORT       Patient Name:   Krystal Chambers Sipp Date of Exam: 04/18/2023  Medical Rec #:  [id number]     Height:       58.0 in  Accession #:    [id number]    Weight:       153.0 lb  Date of Birth:  03/29/1935    BSA:          1.625 m  Patient Age:    87 years      BP:           163/93 mmHg  Patient Gender: F             HR:           68 bpm.  Exam Location:  Outpatient   Procedure: 2D Echo, Color Doppler and Cardiac Doppler   Indications:    R01.0 Heart Murmur    History:        Patient has no prior history of Echocardiogram  examinations.                 Risk  Factors:Hypertension and R Breast CA/Mastectomy.    Sonographer:    L. Thornton-Maynard  Referring Phys: [id number] [facility] J PATWARDHAN   IMPRESSIONS     1. Left ventricular ejection fraction, by estimation, is 60 to 65%. The  left ventricle has normal function. The left ventricle has no regional  wall motion abnormalities. There is severe left ventricular hypertrophy.  Left ventricular diastolic parameters   are consistent with Grade I diastolic dysfunction (impaired relaxation).   2. Right ventricular systolic function is normal. The right ventricular  size is normal. There is normal pulmonary artery systolic pressure.   3. Left atrial size was mildly dilated.   4. The mitral valve is abnormal. Mild mitral valve regurgitation. No  evidence of mitral stenosis.   5. The aortic valve is tricuspid. There is severe calcifcation of the  aortic valve. There is severe thickening of the aortic valve. Aortic valve  regurgitation is mild. Severe aortic valve stenosis.   6. The inferior vena cava is normal in size with greater than 50%  respiratory variability, suggesting right atrial pressure of 3 mmHg.   FINDINGS   Left Ventricle: Left ventricular ejection fraction, by estimation, is 60  to 65%. The left ventricle has normal function. The left ventricle has no  regional wall motion abnormalities. The left ventricular internal cavity  size was normal in size. There is   severe left ventricular hypertrophy. Left ventricular diastolic  parameters are consistent with Grade I diastolic dysfunction (impaired  relaxation).   Right Ventricle: The right ventricular size is normal. No increase in  right ventricular wall thickness. Right ventricular systolic function is  normal. There is normal pulmonary artery systolic pressure. The tricuspid  regurgitant velocity is 2.37 m/s, and   with an assumed right atrial pressure of 3 mmHg, the estimated right  ventricular systolic pressure is 25.5 mmHg.    Left Atrium: Left atrial size was mildly dilated.   Right Atrium: Right atrial size was normal in size.   Pericardium: There is no evidence of pericardial effusion.   Mitral Valve: The mitral valve is abnormal. There is mild thickening of  the mitral valve leaflet(s). There is mild calcification of the mitral  valve leaflet(s). Mild mitral annular calcification. Mild mitral valve  regurgitation. No evidence of mitral  valve stenosis.   Tricuspid Valve: The tricuspid valve is normal in structure. Tricuspid  valve regurgitation is mild . No evidence of tricuspid stenosis.   Aortic Valve: The aortic valve is tricuspid. There is severe calcifcation  of the aortic valve. There is severe thickening of the aortic valve.  Aortic valve regurgitation is mild. Aortic regurgitation PHT measures 595  msec. Severe aortic stenosis is  present. Aortic valve mean gradient measures 51.0 mmHg. Aortic valve peak  gradient measures 79.7 mmHg. Aortic valve area, by VTI measures 0.89 cm.   Pulmonic Valve: The pulmonic valve was normal in structure. Pulmonic valve  regurgitation is not visualized. No evidence of pulmonic stenosis.   Aorta: The aortic root is normal in size and structure.   Venous: The inferior vena cava is normal in size with greater than 50%  respiratory variability, suggesting right atrial pressure of 3 mmHg.   IAS/Shunts: No atrial level shunt detected by color flow Doppler.     LEFT VENTRICLE  PLAX 2D  LVIDd:         3.90 cm     Diastology  LVIDs:         2.40 cm     LV e' medial:    5.22 cm/s  LV PW:         1.30 cm     LV E/e' medial:  9.8  LV IVS:        1.70 cm     LV e' lateral:   5.44 cm/s  LVOT diam:     2.00 cm     LV E/e' lateral: 9.4  LV SV:         95  LV SV Index:   58  LVOT Area:     3.14 cm    LV Volumes (MOD)  LV vol d, MOD A2C: 52.0 ml  LV vol d, MOD A4C: 47.8 ml  LV vol s, MOD A2C: 9.5 ml  LV vol s, MOD A4C: 12.7 ml  LV SV MOD A2C:     42.5 ml  LV  SV MOD A4C:     47.8 ml  LV SV MOD BP:      39.7 ml   RIGHT VENTRICLE  RV Basal diam:  2.80 cm  RV S prime:     9.57 cm/s  TAPSE (M-mode): 2.0 cm   LEFT ATRIUM             Index        RIGHT ATRIUM          Index  LA diam:        3.70 cm 2.28 cm/m   RA Area:     9.09 cm  LA Vol (A2C):   42.9 ml 26.40 ml/m  RA Volume:   16.20 ml 9.97 ml/m  LA Vol (A4C):   39.8 ml 24.49 ml/m  LA Biplane Vol: 43.9 ml 27.01 ml/m   AORTIC VALVE                     PULMONIC VALVE  AV Area (Vmax):    0.98 cm      PV Vmax:          0.99 m/s  AV Area (Vmean):   0.93 cm      PV Peak grad:  3.9 mmHg  AV Area (VTI):     0.89 cm      PR End Diast Vel: 6.10 msec  AV Vmax:           446.33 cm/s  AV Vmean:          340.333 cm/s  AV VTI:            1.063 m  AV Peak Grad:      79.7 mmHg  AV Mean Grad:      51.0 mmHg  LVOT Vmax:         139.00 cm/s  LVOT Vmean:        101.000 cm/s  LVOT VTI:          0.302 m  LVOT/AV VTI ratio: 0.28  AI PHT:            595 msec    AORTA  Ao Root diam: 2.80 cm  Ao Asc diam:  3.20 cm   MITRAL VALVE                TRICUSPID VALVE  MV Area (PHT): 2.32 cm     TR Peak grad:   22.5 mmHg  MV Decel Time: 327 msec     TR Vmax:        237.00 cm/s  MV E velocity: 51.40 cm/s  MV A velocity: 116.00 cm/s  SHUNTS  MV E/A ratio:  0.44         Systemic VTI:  0.30 m                              Systemic Diam: 2.00 cm   Charlton Haws MD  Electronically signed by Charlton Haws MD  Signature Date/Time: 04/18/2023/10:02:38 AM        Final        Physicians   Panel Physicians Referring Physician Case Authorizing Physician  Patwardhan, Anabel Bene, MD (Primary)        Procedures   RIGHT HEART CATH AND CORONARY ANGIOGRAPHY    Conclusion   LM: Normal LAD: 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 No shock present.    Procedural Details   Technical Details Procedures: 1. Ultrasound guided right radial artery and right AC veinous access 2. Right heart catheterization 3. Selective left and right coronary angiography 4. Conscious sedation monitoring 52 min  Indication: Severe aortic stenosis  History: 88 y.o. African-American female with hypertension, hyperlipidemia, severe aortic stenosis.   Due to tortuosity in the radial artery, as well as right subclavian artery, 6 French destination sheath was used.  Diagnostic Angiography: Catheter/s advances over guidewire under fluoroscopy Left coronary artery: 5 Fr JL 3.5  Right coronary artery: 5 Fr JR 4 Left heart catheterization: Not performed   Pressures tracings obtained in right atrium, right ventricle, pulmonary artery, and pulmonary capillary wedge position. Invasive hemodynamic measurements performed using Fick method.     Anticoagulation:  4000 units heparin  Hemostasis:  TR band  Total contrast used: 8.8 cc   Total fluoro time: 8.8 min Air Kerma: 195 mGy  Conscious sedation was administered under my direct supervision. IV Versed 0.5 mg and fentanyl 12.5 mcg administered. Continuous ECG, pulse oximetry and blood pressure were monitored throughout the entire procedure. Monitoring corroborated by cath lab nurse and technician.  Total sedation time: 52 minutes.  All wires and catheters removed out of the body at the end of the procedure Final angiogram showed no dissection/perforation.   [image] Elder Negus, MD Pager: [phone] Office: [phone]          Estimated blood loss  <50 mL.   During this procedure medications were administered to achieve and maintain moderate conscious sedation while the patient's heart rate, blood pressure, and oxygen saturation were continuously monitored and I was present face-to-face 100% of this time.    Medications (Filter: Administrations occurring from 0940 to 1103 on 05/07/23)  important  Continuous medications are totaled by the amount administered until 05/07/23 1103.    Heparin (Porcine) in NaCl 1000-0.9 UT/500ML-% SOLN (mL)  Total volume: 1,000 mL Date/Time Rate/Dose/Volume Action    05/07/23 1002 500 mL Given    1002 500 mL Given    midazolam (VERSED) injection (mg)  Total dose: 0.5 mg Date/Time Rate/Dose/Volume Action    05/07/23 1003 0.5 mg Given    fentaNYL (SUBLIMAZE) injection (mcg)  Total dose: 12.5 mcg Date/Time Rate/Dose/Volume Action    05/07/23 1003 12.5 mcg Given    lidocaine (PF) (XYLOCAINE) 1 % injection (mL)  Total volume: 4 mL Date/Time Rate/Dose/Volume Action    05/07/23 1009 2 mL Given    1020 2 mL Given    Radial Cocktail/Verapamil only (mL)  Total volume: 10 mL Date/Time Rate/Dose/Volume Action    05/07/23 1023 10 mL Given    heparin sodium (porcine) injection (Units)  Total dose: 4,000 Units Date/Time Rate/Dose/Volume Action    05/07/23 1035 2,000 Units Given    1041 2,000 Units Given    Radial Cocktail/Verapamil only (mL)  Total volume: 10 mL Date/Time Rate/Dose/Volume Action    05/07/23 1037 10 mL Given    iohexol (OMNIPAQUE) 350 MG/ML injection (mL)  Total volume: 40 mL Date/Time Rate/Dose/Volume Action    05/07/23 1053 40 mL Given      Sedation Time   Sedation Time Physician-1: 52 minutes 6 seconds Contrast        Administrations occurring from 0940 to 1103 on 05/07/23:  Medication Name Total Dose  iohexol (OMNIPAQUE) 350 MG/ML injection 40 mL    Radiation/Fluoro   Fluoro time: 8.8 (min) DAP: 13848 (mGycm2) Cumulative Air Kerma: 195 (mGy) Complications    Complications documented before study signed (05/08/2023  7:34 AM)    No complications were associated with this study.  Documented by Loma Messing B - 05/07/2023 10:50 AM      Coronary Findings   Diagnostic Dominance: Right Left Main  Vessel is normal in caliber. Vessel is angiographically normal.    Left Anterior Descending  Aneurysmal prox-mid LAD without significant stenosis    Left Circumflex  Vessel is normal in caliber. Vessel is angiographically normal.    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    Right Heart   Right Heart Pressures RA: 2 mmHg RV: 28/6 mmHg PA: 27/13 mmHg, mPAP 19 mmHg PCW: 12 mmHg  CO: 6 L/min CI: 3.7 L/min/m2    Coronary Diagrams  Diagnostic Dominance: Right  Intervention    Implants    No implant documentation for this case.    Syngo Images    Show images for CARDIAC CATHETERIZATION Images on Long Term Storage    Show images for Colomb, [facility] FERENCE to Procedure Log   Procedure Log    Link to Procedure Log   Procedure Log    Hemo Data   Flowsheet Row Most Recent Value  Fick Cardiac Output 6.04 L/min  Fick Cardiac Output Index 3.73 (L/min)/BSA  RA A Wave 7 mmHg  RA V Wave 4 mmHg  RA Mean 2 mmHg  RV Systolic Pressure 28 mmHg  RV Diastolic Pressure 6 mmHg  RV EDP 6 mmHg  PA Systolic Pressure 27 mmHg  PA Diastolic Pressure 13 mmHg  PA Mean 19 mmHg  PW A Wave 13 mmHg  PW V Wave 12 mmHg  PW Mean 12 mmHg  AO Systolic Pressure 139 mmHg  AO Diastolic Pressure 66 mmHg  AO Mean 97 mmHg  QP/QS 1  TPVR Index 5.1 HRUI  TSVR Index 26.04 HRUI  PVR SVR Ratio 0.07  TPVR/TSVR Ratio 0.2      ADDENDUM REPORT: 05/13/2023 18:32   CLINICAL DATA:  Aortic Valve pathology with assessment for TAVR   EXAM: Cardiac TAVR CT   TECHNIQUE: The patient was scanned on a Siemens Force 192 slice scanner. A 120 kV retrospective scan was  triggered in the descending thoracic aorta at 111 HU's. Gantry rotation speed was 270 msecs and collimation was .9 mm. No beta blockade or nitro were given. The 3D data set was reconstructed in 5% intervals of the R-R cycle. Systolic and diastolic phases were analyzed on a dedicated work station using MPR, MIP and VRT modes. The patient received 95 cc of contrast.   FINDINGS: Aortic Valve: Severely thickened tri-leaflet aortic valve with heavy calcification and reduced excursion the planimeter valve area is 0.925 Sq cm consistent with severe aortic stenosis   LVOT calcification: none   Annular calcification: none   Aortic Valve Calcium Score: 2058   Presence of basal septal hypertrophy: Yes   - Systolic annular measurements > diastolic measurements; see below   Perimembranous septal diameter: 5 mm   Mitral Valve: No calcifications   Aortic Annulus Measurements- 20%   Major annulus diameter: 23 mm   Minor annulus diameter:18 mm   Annular perimeter: 66 mm   Annular area: 3.30 cm2   Aortic Root Measurements   Sinotubular Junction: 25 mm   Ascending Thoracic Aorta: 31 mm   Aortic Arch: 24 mm   Descending Thoracic Aorta: 27 mm   Aortic atherosclerosis.   Sinus of Valsalva Measurements:   Right coronary cusp width: 29 mm   Left coronary cusp width: 28 mm   Non coronary cusp width: 28 mm   Coronary Artery Height above Annulus:   Left Main: 11 mm   Left SoV height: 16 mm   Right Coronary: 16 mm   Right SoV height: 17 mm   Optimum Fluoroscopic Angle for Delivery: LAO 4, CAU 3   Cusp overlay view angle: RAO 9, CAU 9   Valves for structural team consideration: Sinus dimensions supportive of a 26 mm Evolut Valve   At the overlap between a 20 mm and 23 mm Sapien Valve   Non TAVR Valve Findings:   Coronary Arteries: Normal coronary origin. Study not completed with nitroglycerin.   Coronary Calcium Score:   Left main: 51   Left anterior descending  artery: 21   Left circumflex artery: 14   Right coronary artery: 4   Total: 90   Systemic veins: Normal anatomy   Main Pulmonary artery: Normal caliber   Pulmonary veins: Normal anatomy   Left atrial appendage: Patent   Interatrial septum: No clear communications.   Left ventricle: Severe septal hypertrophy (18 mm). No systolic anterior motion of the anterior mitral valve leaflet.   Left atrium: Normal size   Right ventricle: Normal size   Right atrium: Normal size   Pericardium: No calcifications   Extra Cardiac Findings as per separate reporting.   IMPRESSION: 1. Severe Aortic stenosis. Findings pertinent to TAVR procedure are detailed above.   RECOMMENDATIONS:   The proposed cut-off value of 1,651 AU yielded a 93 % sensitivity and 75 % specificity in grading AS severity in patients with classical low-flow, low-gradient AS. Proposed different cut-off values to define severe AS for men and women as 2,065 AU and 1,274 AU, respectively. The joint European and American recommendations for the assessment of AS consider the aortic valve calcium score as a continuum - a very high calcium score suggests severe AS and a low calcium score suggests severe AS is unlikely.   Sunday Shams, et al. 2017 ESC/EACTS Guidelines for the management of valvular heart disease. Eur Heart J [phone]   Coronary artery calcium (CAC) score is a strong predictor of incident coronary heart disease (CHD) and provides predictive information beyond traditional risk factors. CAC scoring is reasonable to use in the decision to withhold, postpone, or initiate statin therapy in intermediate-risk or selected borderline-risk asymptomatic adults (age 29-75 years and LDL-C >=70 to <190 mg/dL) who do not have diabetes or established atherosclerotic cardiovascular disease (ASCVD).* In intermediate-risk (10-year ASCVD risk >=7.5% to <20%) adults or selected borderline-risk  (10-year ASCVD risk >=5% to <7.5%) adults in whom a CAC score is measured for the purpose of making a treatment decision the following recommendations have been made:   If CAC = 0, it is reasonable to withhold statin therapy and reassess in 5 to 10 years, as long as higher risk conditions are absent (diabetes mellitus, family history of premature CHD in first degree relatives (males <55 years; females <65 years), cigarette smoking, LDL >=190 mg/dL or other independent risk factors).   If CAC is 1 to 99, it is reasonable to initiate statin therapy for patients >=34 years of age.   If CAC is >=100 or >=75th percentile, it is reasonable to initiate statin therapy at any age.   Cardiology referral should be considered for patients with CAC scores >=400 or >=75th percentile.   *2018 AHA/ACC/AACVPR/AAPA/ABC/ACPM/ADA/AGS/APhA/ASPC/NLA/PCNA Guideline on the Management of Blood Cholesterol: A Report of the American College of Cardiology/American Heart Association Task Force on Clinical Practice Guidelines. J Am Coll Cardiol. 2019;73(24):3168-3209.   Mahesh  Chandrasekhar     Electronically Signed   By: Riley Lam M.D.   On: 05/13/2023 18:32   Addended by Christell Constant, MD on 05/13/2023  6:34 PM     Narrative & Impression  CLINICAL DATA:  Preop evaluation for aortic valve replacement.   EXAM: CT ANGIOGRAPHY CHEST, ABDOMEN AND PELVIS   TECHNIQUE: Non-contrast CT of the chest was initially obtained.   Multidetector CT imaging through the chest, abdomen and pelvis was performed using the standard protocol during bolus administration of intravenous contrast. Multiplanar reconstructed images and MIPs were obtained and reviewed to evaluate the vascular anatomy.   RADIATION DOSE REDUCTION: This exam  was performed according to the departmental dose-optimization program which includes automated exposure control, adjustment of the mA and/or kV according  to patient size and/or use of iterative reconstruction technique.   CONTRAST:  95mL OMNIPAQUE IOHEXOL 350 MG/ML SOLN   COMPARISON:  None Available.   FINDINGS: CTA CHEST FINDINGS   Cardiovascular: Normal heart size. No pericardial effusion. Aortic valve thickening and calcifications. Normal caliber thoracic aorta with mild atherosclerotic disease. Standard three-vessel aortic arch with significant stenosis. Left main and three-vessel coronary artery calcifications.   Mediastinum/Nodes: Small hiatal hernia. Surgical clips of the right axilla. No enlarged nodes seen in the chest.   Lungs/Pleura: Central airways are patent. No consolidation, pleural effusion or pneumothorax.   Musculoskeletal: No chest wall abnormality. No acute or significant osseous findings.   CTA ABDOMEN AND PELVIS FINDINGS   Hepatobiliary: No focal liver abnormality is seen. Status post cholecystectomy. No biliary dilatation.   Pancreas: Cystic lesions of the pancreatic body and head, largest is a lesion of the pancreatic body measuring 1.9 cm on series 5, image 86.   Spleen: Normal in size without focal abnormality.   Adrenals/Urinary Tract: Bilateral adrenal glands are unremarkable. No hydronephrosis or nephrolithiasis. Bladder is unremarkable.   Stomach/Bowel: Stomach is within normal limits. Appendix appears normal. Diverticulosis. No evidence of bowel wall thickening, distention, or inflammatory changes.   Vascular/lymphatic: No significant atherosclerotic disease of the abdominal aorta. Branch vessels are patent. No enlarged lymph nodes seen in the abdomen or pelvis.   Reproductive: Enlarged multi fibroid uterus, some of the fibroids are calcified. No adnexal mass.   Other: No abdominal wall hernia or abnormality. No abdominopelvic ascites.   Musculoskeletal: Intramedullary rod of the left femur. Mild levocurvature of the lumbar spine. No aggressive appearing osseous lesions.    VASCULAR MEASUREMENTS PERTINENT TO TAVR:   AORTA:   Minimal Aortic Diameter-11.5 mm   Severity of Aortic Calcification-mild thoracic.   RIGHT PELVIS:   Right Common Iliac Artery -   Minimal Diameter-8.3 mm   Tortuosity-mild   Calcification-none   Right External Iliac Artery -   Minimal Diameter-7.1 mm   Tortuosity-mild   Calcification-none   Right Common Femoral Artery -   Minimal Diameter-7.3 mm   Tortuosity-none   Calcification-none   LEFT PELVIS:   Left Common Iliac Artery -   Minimal Diameter-8.0 mm   Tortuosity-mild   Calcification-none   Left External Iliac Artery -   Minimal Diameter-6.7 mm   Tortuosity-mild   Calcification-none   Left Common Femoral Artery -   Minimal Diameter-6.9 mm   Tortuosity-mild   Calcification-none   Review of the MIP images confirms the above findings.   IMPRESSION: 1. Vascular findings and measurements pertinent to potential TAVR procedure, as detailed above. 2. Thickening and calcification of the aortic valve, compatible with reported clinical history of aortic stenosis. 3. Mild aortic atherosclerosis. No significant atherosclerotic disease of the iliac arteries. 4. Left main and 3 vessel coronary artery disease. 5. Multiple cystic pancreatic lesions, largest is a lesion of the pancreatic body measuring 1.9 cm. Recommend follow-up with MRCP in 2 years.     Electronically Signed   By: Allegra Lai M.D.   On: 05/13/2023 14:59          Impression:   This 88 year old woman has stage D, severe, symptomatic aortic stenosis with NYHA class II symptoms of exertional fatigue and shortness of breath consistent with chronic diastolic congestive heart failure.  I have personally reviewed her 2D echocardiogram, cardiac catheterization, and CTA  studies.  Her echocardiogram shows a trileaflet aortic valve with severe calcification and thickening and restricted leaflet mobility.  The mean gradient is 51 mmHg  with a valve area of 0.89 cm consistent with severe aortic stenosis.  There is mild regurgitation.  Left ventricular ejection fraction is 60 to 65%.  Cardiac catheterization showed no significant coronary disease with normal filling pressures.  I agree that aortic valve replacement is indicated in this patient for relief of her symptoms and to prevent left ventricular deterioration.  Given her advanced age I think transcatheter aortic valve replacement would be the best option for treating her.  Her gated cardiac CTA shows anatomy suitable for TAVR using a 26 mm Evolut FX valve.  Her abdominal and pelvic CTA shows adequate pelvic vascular anatomy to allow transfemoral insertion.   The patient and her daughter were counseled at length regarding treatment alternatives for management of severe symptomatic aortic stenosis. The risks and benefits of surgical intervention has been discussed in detail. Long-term prognosis with medical therapy was discussed. Alternative approaches such as conventional surgical aortic valve replacement, transcatheter aortic valve replacement, and palliative medical therapy were compared and contrasted at length. This discussion was placed in the context of the patient's own specific clinical presentation and past medical history. All of their questions have been addressed.    Following the decision to proceed with transcatheter aortic valve replacement, a discussion was held regarding what types of management strategies would be attempted intraoperatively in the event of life-threatening complications, including whether or not the patient would be considered a candidate for the use of cardiopulmonary bypass and/or conversion to open sternotomy for attempted surgical intervention.  Given her advanced age and comorbid risk factors I do not think she is a candidate for emergent sternotomy to manage any intraoperative complications.  The patient has been advised of a variety of  complications that might develop including but not limited to risks of death, stroke, paravalvular leak, aortic dissection or other major vascular complications, aortic annulus rupture, device embolization, cardiac rupture or perforation, mitral regurgitation, acute myocardial infarction, arrhythmia, heart block or bradycardia requiring permanent pacemaker placement, congestive heart failure, respiratory failure, renal failure, pneumonia, infection, other late complications related to structural valve deterioration or migration, or other complications that might ultimately cause a temporary or permanent loss of functional independence or other long term morbidity. The patient provides full informed consent for the procedure as described and all questions were answered.       Plan:   Transfemoral TAVR using a [employer] Evolut FX valve.     Alleen Borne, MD

## 2023-07-02 ENCOUNTER — Inpatient Hospital Stay (HOSPITAL_COMMUNITY): Payer: Medicare Other | Admitting: Certified Registered Nurse Anesthetist

## 2023-07-02 ENCOUNTER — Encounter (HOSPITAL_COMMUNITY): Payer: Self-pay | Admitting: Cardiovascular Disease

## 2023-07-02 ENCOUNTER — Other Ambulatory Visit: Payer: Self-pay | Admitting: Physician Assistant

## 2023-07-02 ENCOUNTER — Other Ambulatory Visit: Payer: Self-pay

## 2023-07-02 ENCOUNTER — Inpatient Hospital Stay (HOSPITAL_COMMUNITY)
Admission: RE | Admit: 2023-07-02 | Discharge: 2023-07-04 | DRG: 267 | Disposition: A | Discharge status: Home / Self Care | Payer: Medicare Other | Attending: Cardiovascular Disease | Admitting: Cardiovascular Disease

## 2023-07-02 ENCOUNTER — Inpatient Hospital Stay (HOSPITAL_COMMUNITY): Payer: Self-pay | Admitting: Physician Assistant

## 2023-07-02 ENCOUNTER — Inpatient Hospital Stay (HOSPITAL_COMMUNITY)
Admission: RE | Admit: 2023-07-02 | Discharge: 2023-07-02 | Disposition: A | Discharge status: Home / Self Care | Payer: Medicare Other | Source: Ambulatory Visit | Attending: Cardiovascular Disease | Admitting: Cardiovascular Disease

## 2023-07-02 ENCOUNTER — Encounter (HOSPITAL_COMMUNITY)
Admission: RE | Disposition: A | Discharge status: Home / Self Care | Payer: Self-pay | Source: Home / Self Care | Attending: Cardiovascular Disease

## 2023-07-02 DIAGNOSIS — Z6831 Body mass index (BMI) 31.0-31.9, adult: Secondary | ICD-10-CM | POA: Diagnosis not present

## 2023-07-02 DIAGNOSIS — E669 Obesity, unspecified: Secondary | ICD-10-CM

## 2023-07-02 DIAGNOSIS — Z952 Presence of prosthetic heart valve: Secondary | ICD-10-CM

## 2023-07-02 DIAGNOSIS — I452 Bifascicular block: Secondary | ICD-10-CM | POA: Diagnosis not present

## 2023-07-02 DIAGNOSIS — Z8249 Family history of ischemic heart disease and other diseases of the circulatory system: Secondary | ICD-10-CM

## 2023-07-02 DIAGNOSIS — I350 Nonrheumatic aortic (valve) stenosis: Principal | ICD-10-CM

## 2023-07-02 DIAGNOSIS — M1712 Unilateral primary osteoarthritis, left knee: Secondary | ICD-10-CM | POA: Diagnosis present

## 2023-07-02 DIAGNOSIS — Z961 Presence of intraocular lens: Secondary | ICD-10-CM | POA: Diagnosis present

## 2023-07-02 DIAGNOSIS — I700 Atherosclerosis of aorta: Secondary | ICD-10-CM | POA: Diagnosis present

## 2023-07-02 DIAGNOSIS — Z006 Encounter for examination for normal comparison and control in clinical research program: Secondary | ICD-10-CM | POA: Diagnosis not present

## 2023-07-02 DIAGNOSIS — I442 Atrioventricular block, complete: Secondary | ICD-10-CM | POA: Diagnosis not present

## 2023-07-02 DIAGNOSIS — I10 Essential (primary) hypertension: Secondary | ICD-10-CM | POA: Diagnosis present

## 2023-07-02 DIAGNOSIS — E785 Hyperlipidemia, unspecified: Secondary | ICD-10-CM | POA: Diagnosis not present

## 2023-07-02 DIAGNOSIS — I2510 Atherosclerotic heart disease of native coronary artery without angina pectoris: Secondary | ICD-10-CM | POA: Diagnosis present

## 2023-07-02 DIAGNOSIS — Z9049 Acquired absence of other specified parts of digestive tract: Secondary | ICD-10-CM

## 2023-07-02 DIAGNOSIS — E876 Hypokalemia: Secondary | ICD-10-CM | POA: Diagnosis present

## 2023-07-02 DIAGNOSIS — Z9011 Acquired absence of right breast and nipple: Secondary | ICD-10-CM | POA: Diagnosis not present

## 2023-07-02 DIAGNOSIS — R001 Bradycardia, unspecified: Secondary | ICD-10-CM | POA: Diagnosis present

## 2023-07-02 DIAGNOSIS — Z853 Personal history of malignant neoplasm of breast: Secondary | ICD-10-CM

## 2023-07-02 DIAGNOSIS — Z79899 Other long term (current) drug therapy: Secondary | ICD-10-CM

## 2023-07-02 DIAGNOSIS — K869 Disease of pancreas, unspecified: Secondary | ICD-10-CM | POA: Diagnosis present

## 2023-07-02 DIAGNOSIS — Z90710 Acquired absence of both cervix and uterus: Secondary | ICD-10-CM | POA: Diagnosis not present

## 2023-07-02 HISTORY — PX: TRANSCATHETER AORTIC VALVE REPLACEMENT, TRANSFEMORAL: SHX6400

## 2023-07-02 HISTORY — DX: Presence of prosthetic heart valve: Z95.2

## 2023-07-02 HISTORY — PX: INTRAOPERATIVE TRANSTHORACIC ECHOCARDIOGRAM: SHX6523

## 2023-07-02 LAB — BASIC METABOLIC PANEL
Anion gap: 12 (ref 5–15)
BUN: 19 mg/dL (ref 8–23)
CO2: 23 mmol/L (ref 22–32)
Calcium: 10.8 mg/dL — ABNORMAL HIGH (ref 8.9–10.3)
Chloride: 108 mmol/L (ref 98–111)
Creatinine, Ser: 1.19 mg/dL — ABNORMAL HIGH (ref 0.44–1.00)
GFR, Estimated: 45 mL/min — ABNORMAL LOW (ref >60)
Glucose, Bld: 137 mg/dL — ABNORMAL HIGH (ref 70–99)
Potassium: 3.5 mmol/L (ref 3.5–5.1)
Sodium: 143 mmol/L (ref 135–145)

## 2023-07-02 LAB — ECHOCARDIOGRAM LIMITED
AR max vel: 2.42 cm2
AV Area VTI: 2.44 cm2
AV Area mean vel: 3.18 cm2
AV Mean grad: 4 mmHg
AV Peak grad: 11.3 mmHg
Ao pk vel: 1.68 m/s
Calc EF: 74.1 %
Single Plane A2C EF: 75.7 %
Single Plane A4C EF: 75 %

## 2023-07-02 LAB — POCT ACTIVATED CLOTTING TIME
Activated Clotting Time: 250 s
Activated Clotting Time: 122 seconds

## 2023-07-02 SURGERY — IMPLANTATION, AORTIC VALVE, TRANSCATHETER, FEMORAL APPROACH
Anesthesia: Monitor Anesthesia Care

## 2023-07-02 MED ORDER — OXYCODONE HCL 5 MG PO TABS
5.0000 mg | ORAL_TABLET | ORAL | Status: DC | PRN
  Administered 2023-07-03: 10 mg via ORAL
  Administered 2023-07-03: 5 mg via ORAL
  Administered 2023-07-03 – 2023-07-04 (×2): 10 mg via ORAL
  Filled 2023-07-02: qty 2
  Filled 2023-07-02: qty 1
  Filled 2023-07-02 (×2): qty 2

## 2023-07-02 MED ORDER — FENTANYL CITRATE (PF) 250 MCG/5ML IJ SOLN
INTRAMUSCULAR | Status: DC | PRN
  Administered 2023-07-02: 25 ug via INTRAVENOUS

## 2023-07-02 MED ORDER — CHLORHEXIDINE GLUCONATE 4 % EX SOLN: 30.0000 mL | CUTANEOUS | Status: DC

## 2023-07-02 MED ORDER — LISINOPRIL 20 MG PO TABS
40.0000 mg | ORAL_TABLET | Freq: Every morning | ORAL | Status: DC
  Administered 2023-07-03 – 2023-07-04 (×2): 40 mg via ORAL
  Filled 2023-07-02 (×3): qty 2

## 2023-07-02 MED ORDER — SODIUM CHLORIDE 0.9% FLUSH: 3.0000 mL | INTRAVENOUS | Status: DC | PRN

## 2023-07-02 MED ORDER — ONDANSETRON HCL 4 MG/2ML IJ SOLN: 4.0000 mg | Freq: Four times a day (QID) | INTRAMUSCULAR | Status: DC | PRN

## 2023-07-02 MED ORDER — PROPOFOL 10 MG/ML IV BOLUS
INTRAVENOUS | Status: DC | PRN
  Administered 2023-07-02: 10 mg via INTRAVENOUS

## 2023-07-02 MED ORDER — TRAMADOL HCL 50 MG PO TABS: 50.0000 mg | ORAL_TABLET | ORAL | Status: DC | PRN

## 2023-07-02 MED ORDER — ACETAMINOPHEN 10 MG/ML IV SOLN: 1000.0000 mg | Freq: Once | INTRAVENOUS | Status: DC | PRN

## 2023-07-02 MED ORDER — AMLODIPINE BESYLATE 10 MG PO TABS
10.0000 mg | ORAL_TABLET | Freq: Every morning | ORAL | Status: DC
  Administered 2023-07-03 – 2023-07-04 (×2): 10 mg via ORAL
  Filled 2023-07-02: qty 1
  Filled 2023-07-02: qty 2
  Filled 2023-07-02: qty 1

## 2023-07-02 MED ORDER — LACTATED RINGERS IV SOLN: INTRAVENOUS | Status: DC | PRN

## 2023-07-02 MED ORDER — ONDANSETRON HCL 4 MG/2ML IJ SOLN: 4.0000 mg | Freq: Once | INTRAMUSCULAR | Status: DC | PRN

## 2023-07-02 MED ORDER — LIDOCAINE HCL (PF) 1 % IJ SOLN
INTRAMUSCULAR | Status: DC | PRN
  Administered 2023-07-02 (×2): 10 mL

## 2023-07-02 MED ORDER — NITROGLYCERIN IN D5W 200-5 MCG/ML-% IV SOLN
0.0000 ug/min | INTRAVENOUS | Status: DC
  Administered 2023-07-02: 10 ug/min via INTRAVENOUS
  Filled 2023-07-02: qty 250

## 2023-07-02 MED ORDER — ACETAMINOPHEN 650 MG RE SUPP: 650.0000 mg | Freq: Four times a day (QID) | RECTAL | Status: DC | PRN

## 2023-07-02 MED ORDER — CEFAZOLIN SODIUM-DEXTROSE 2-4 GM/100ML-% IV SOLN
2.0000 g | Freq: Three times a day (TID) | INTRAVENOUS | Status: AC
  Administered 2023-07-02: 2 g via INTRAVENOUS
  Filled 2023-07-02 (×2): qty 100

## 2023-07-02 MED ORDER — PROTAMINE SULFATE 10 MG/ML IV SOLN
INTRAVENOUS | Status: AC
  Filled 2023-07-02: qty 5

## 2023-07-02 MED ORDER — SODIUM CHLORIDE 0.9 % IV SOLN: INTRAVENOUS | Status: AC

## 2023-07-02 MED ORDER — LIDOCAINE HCL (PF) 1 % IJ SOLN
INTRAMUSCULAR | Status: AC
  Filled 2023-07-02: qty 30

## 2023-07-02 MED ORDER — CHLORHEXIDINE GLUCONATE CLOTH 2 % EX PADS
6.0000 | MEDICATED_PAD | Freq: Every day | CUTANEOUS | Status: DC
  Administered 2023-07-02 – 2023-07-04 (×3): 6 via TOPICAL

## 2023-07-02 MED ORDER — HEPARIN SODIUM (PORCINE) 1000 UNIT/ML IJ SOLN
INTRAMUSCULAR | Status: DC | PRN
  Administered 2023-07-02: 3000 [IU] via INTRAVENOUS
  Administered 2023-07-02: 11000 [IU] via INTRAVENOUS

## 2023-07-02 MED ORDER — CHLORHEXIDINE GLUCONATE 4 % EX SOLN: 60.0000 mL | Freq: Once | CUTANEOUS | Status: DC

## 2023-07-02 MED ORDER — SODIUM CHLORIDE 0.9% FLUSH
3.0000 mL | Freq: Two times a day (BID) | INTRAVENOUS | Status: DC
  Administered 2023-07-02 – 2023-07-04 (×4): 3 mL via INTRAVENOUS

## 2023-07-02 MED ORDER — SODIUM CHLORIDE 0.9 % IV SOLN: 250.0000 mL | INTRAVENOUS | Status: DC | PRN

## 2023-07-02 MED ORDER — MORPHINE SULFATE (PF) 2 MG/ML IV SOLN: 1.0000 mg | INTRAVENOUS | Status: DC | PRN

## 2023-07-02 MED ORDER — HEPARIN (PORCINE) IN NACL 1000-0.9 UT/500ML-% IV SOLN
INTRAVENOUS | Status: DC | PRN
  Administered 2023-07-02 (×2): 500 mL

## 2023-07-02 MED ORDER — PROPOFOL 500 MG/50ML IV EMUL
INTRAVENOUS | Status: DC | PRN
  Administered 2023-07-02: 50 ug/kg/min via INTRAVENOUS

## 2023-07-02 MED ORDER — PROTAMINE SULFATE 10 MG/ML IV SOLN
INTRAVENOUS | Status: DC | PRN
Start: 2023-07-02 — End: 2023-07-02
  Administered 2023-07-02: 100 mg via INTRAVENOUS

## 2023-07-02 MED ORDER — ROSUVASTATIN CALCIUM 20 MG PO TABS
20.0000 mg | ORAL_TABLET | Freq: Every morning | ORAL | Status: DC
  Administered 2023-07-03 – 2023-07-04 (×2): 20 mg via ORAL
  Filled 2023-07-02 (×3): qty 1

## 2023-07-02 MED ORDER — CHLORHEXIDINE GLUCONATE 0.12 % MT SOLN
15.0000 mL | Freq: Once | OROMUCOSAL | Status: AC
  Administered 2023-07-02: 15 mL via OROMUCOSAL
  Filled 2023-07-02 (×2): qty 15

## 2023-07-02 MED ORDER — FENTANYL CITRATE (PF) 100 MCG/2ML IJ SOLN: 25.0000 ug | INTRAMUSCULAR | Status: DC | PRN

## 2023-07-02 MED ORDER — FENTANYL CITRATE (PF) 100 MCG/2ML IJ SOLN
INTRAMUSCULAR | Status: AC
  Filled 2023-07-02: qty 2

## 2023-07-02 MED ORDER — AMISULPRIDE (ANTIEMETIC) 5 MG/2ML IV SOLN: 10.0000 mg | Freq: Once | INTRAVENOUS | Status: DC | PRN

## 2023-07-02 MED ORDER — POTASSIUM CHLORIDE CRYS ER 20 MEQ PO TBCR
20.0000 meq | EXTENDED_RELEASE_TABLET | Freq: Once | ORAL | Status: AC
  Administered 2023-07-02: 20 meq via ORAL
  Filled 2023-07-02: qty 1

## 2023-07-02 MED ORDER — ACETAMINOPHEN 325 MG PO TABS
650.0000 mg | ORAL_TABLET | Freq: Four times a day (QID) | ORAL | Status: DC | PRN
  Administered 2023-07-02 – 2023-07-03 (×2): 650 mg via ORAL
  Filled 2023-07-02 (×2): qty 2

## 2023-07-02 MED ORDER — SODIUM CHLORIDE 0.9 % IV SOLN: INTRAVENOUS | Status: DC

## 2023-07-02 SURGICAL SUPPLY — 35 items
BAG SNAP BAND KOVER 36X36 (MISCELLANEOUS) ×2 IMPLANT
BALLN TRUE 18X4.5 (BALLOONS) ×1
BALLOON TRUE 18X4.5 (BALLOONS) IMPLANT
CABLE ADAPT PACING TEMP 12FT (ADAPTER) IMPLANT
CATH ANGIO 5F PIGTAIL 100CM (CATHETERS) IMPLANT
CATH DIAG 6FR PIGTAIL ANGLED (CATHETERS) IMPLANT
CATH INFINITI 6F AL1 (CATHETERS) IMPLANT
CATH S G BIP PACING (CATHETERS) IMPLANT
CATH-GARD ARROW CATH SHIELD (MISCELLANEOUS) ×1
CLOSURE PERCLOSE PROSTYLE (VASCULAR PRODUCTS) IMPLANT
COVER DOME SNAP 22 D (MISCELLANEOUS) IMPLANT
DILATOR VESSEL 38 20CM 12FR (INTRODUCER) IMPLANT
KIT MICROPUNCTURE NIT STIFF (SHEATH) IMPLANT
KIT SINGLE USE MANIFOLD (KITS) IMPLANT
PACK CARDIAC CATHETERIZATION (CUSTOM PROCEDURE TRAY) ×1 IMPLANT
SET ATX-X65L (MISCELLANEOUS) IMPLANT
SHEATH BRITE TIP 7FR 35CM (SHEATH) IMPLANT
SHEATH DRYSEAL FLEX 18FR 33CM (SHEATH) IMPLANT
SHEATH PINNACLE 6F 10CM (SHEATH) IMPLANT
SHEATH PINNACLE 8F 10CM (SHEATH) IMPLANT
SHEATH PROBE COVER 6X72 (BAG) IMPLANT
SHIELD CATHGARD ARROW (MISCELLANEOUS) IMPLANT
STOPCOCK MORSE 400PSI 3WAY (MISCELLANEOUS) IMPLANT
SYS EVOLUT FX DELIVERY 23-29 (CATHETERS) ×1
SYS EVOLUT FX LOADING 23-29 (CATHETERS) ×1
SYSTEM EVOLUT FX DELIVRY 23-29 (CATHETERS) IMPLANT
SYSTEM EVOLUT FX LOADING 23-29 (CATHETERS) IMPLANT
TRANSDUCER W/STOPCOCK (MISCELLANEOUS) ×2 IMPLANT
TUBING ART PRESS 72 MALE/FEM (TUBING) IMPLANT
VALVE EVOLUT FX 26 (Valve) IMPLANT
WIRE AMPLATZ SS-J .035X180CM (WIRE) IMPLANT
WIRE EMERALD 3MM-J .035X150CM (WIRE) IMPLANT
WIRE EMERALD 3MM-J .035X260CM (WIRE) IMPLANT
WIRE EMERALD ST .035X260CM (WIRE) IMPLANT
WIRE SAFARI SM CURVE 275 (WIRE) IMPLANT

## 2023-07-02 NOTE — Plan of Care (Signed)
  Problem: Education: Goal: Knowledge of General Education information will improve Description Including pain rating scale, medication(s)/side effects and non-pharmacologic comfort measures Outcome: Progressing   

## 2023-07-02 NOTE — Discharge Summary (Incomplete)
[facility]   MULTIDISCIPLINARY HEART VALVE TEAM  Discharge Summary    Patient ID: Krystal Chambers MRN: [id number]; DOB: 1935-08-23  Admit date: 07/02/2023 Discharge date: 07/04/2023  Primary Care Provider: Daisy Floro, MD  Primary Cardiologist: Elder Negus, MD / Dr. Clifton James & Dr. Laneta Simmers (TAVR)  Discharge Diagnoses    Principal Problem:   S/P TAVR (transcatheter aortic valve replacement) Active Problems:   Essential hypertension   Hyperlipidemia   Severe aortic stenosis   CHB (complete heart block) (HCC)   Allergies No Known Allergies  Diagnostic Studies/Procedures    [facility]  TAVR OPERATIVE NOTE     Date of Procedure:                07/02/2023   Preoperative Diagnosis:      Severe Aortic Stenosis    Postoperative Diagnosis:    Same    Procedure:        Transcatheter Aortic Valve Replacement - Transfemoral Approach             Medtronic Evolut Pro THV (size 26 mm, model # [id number], serial #  [id number] )              Co-Surgeons:                        Verne Carrow, MD and Alleen Borne, MD    Anesthesiologist:                  Bradley Ferris   Echocardiographer:              O'Neal   Pre-operative Echo Findings: Severe aortic stenosis Normal left ventricular systolic function   Post-operative Echo Findings: No paravalvular leak Normal left ventricular systolic function _____________    Echo 07/03/23:  IMPRESSIONS   1. Left ventricular ejection fraction, by estimation, is 60 to 65%. The  left ventricle has normal function. The left ventricle has no regional  wall motion abnormalities. There is mild left ventricular hypertrophy.  Left ventricular diastolic parameters  are consistent with Grade I diastolic dysfunction (impaired relaxation).   2. Right ventricular systolic function is normal. The right ventricular  size is normal.   3. The mitral valve is normal in structure. Mild mitral valve   regurgitation. No evidence of mitral stenosis.   4. The aortic valve has been repaired/replaced. Aortic valve  regurgitation is not visualized. No aortic stenosis is present. Procedure  Date: 07/02/23. Aortic valve area, by VTI measures 2.11 cm. Aortic valve  mean gradient measures 6.2 mmHg. Aortic valve   Vmax measures 1.72 m/s.   5. The inferior vena cava is normal in size with greater than 50%  respiratory variability, suggesting right atrial pressure of 3 mmHg.   _____________   07/03/23 PACEMAKER IMPLANT   Conclusion  SURGEON:  Will Camnitz, MD     PREPROCEDURE DIAGNOSIS:  complete AV block    CONCLUSIONS:   1. Successful implantation of a Abbott Assurity [id number] dual-chamber pacemaker for symptomatic bradycardia  2. No early apparent complications.     During this procedure medications were administered to achieve and maintain moderate conscious sedation while the patient's heart rate, blood pressure, and oxygen saturation were continuously monitored and I was present face-to-face 100% of this time.     Will Elberta Fortis, MD 07/03/2023 2:25 PM  History of Present Illness     Krystal Chambers is a 88 y.o. female with a  history of HTN, HLD, thyroid disease, breast cancer s/p mastectomy, obesity (BMI 32), limited mobility and severe aortic valve stenosis who presented to [facility] on 07/02/23 for planned TAVR.   She was referred to Dr. Rosemary Holms for evaluation of a heart murmur and reported shortness of breath on exertion.  A 2D echocardiogram on 04/18/2023 showed EF 60%, severe LVH, and severe AS with a mean grad 51 mmHg, AVA 0.9 cm2, mild AI/MR. She underwent cardiac catheterization on 05/07/2023 showing no obstructive coronary disease with normal filling pressures.   The patient was evaluated by the multidisciplinary valve team and felt to have severe, symptomatic aortic stenosis and to be a suitable candidate for TAVR, which was set up for 07/02/23.   Hospital Course     Consultants:  none   Severe AS: s/p successful TAVR with a 26 mm Medtronic Evolut FX THV via the TF approach on 07/02/23. Post operative echo showed EF 60%, normally functioning TAVR with a mean gradient of 6.2 mmHg and no PVL. Groin sites are stable. Started on a baby aspirin 81 mg daily. Plan for discharge home with close follow up in the outpatient setting.    CHB: she had an iRBBB on PAT ECG. She developed CHB after valve deployment and temp wire was left in place. She continued to have intermittent CHB. Now s/p Abbott Assurity [id number] dual-chamber pacemaker by Dr. Elberta Fortis. CXR with no PTX and device working normally.   HTN: resume home Norvac 10mg  daily, Lisinopril 40mg  daily, hydrochlorothiazide 25mg  and Coreg 6.25mg  BID.   Hypokalemia/hypomagnesemia: this was supplemented during admission.    Pancreatic lesions: pre TAVR CT showed "multiple cystic pancreatic lesions, largest is a lesion of the pancreatic body measuring 1.9 cm. Recommend follow-up with MRCP in 2 years." This will be dicussed in the outpatient setting.   _____________  Discharge Vitals Blood pressure (!) 107/54, pulse 88, temperature 97.9 F (36.6 C), temperature source Oral, resp. rate (!) 8, height 4\' 10"  (1.473 m), weight 68.4 kg, SpO2 99%.  Filed Weights   07/01/23 1500 07/02/23 0711 07/04/23 0600  Weight: 68 kg 67.6 kg 68.4 kg     GEN: Well nourished, well developed, in no acute distress, obese HEENT: normal Neck: no JVD or masses Cardiac: RRR; no murmurs, rubs, or gallops,no edema  Respiratory:  clear to auscultation bilaterally, normal work of breathing GI: soft, nontender, nondistended, + BS MS: no deformity or atrophy Skin: warm and dry, no rash.  Groin sites clear without hematoma or ecchymosis. Pacer in place with bandage.  Neuro:  Alert and Oriented x 3, Strength and sensation are intact Psych: euthymic mood, full affect   Labs & Radiologic Studies    CBC Recent Labs    07/03/23 0501 07/04/23 0108  WBC 9.4  12.5*  HGB 11.1* 11.7*  HCT 34.6* 35.3*  MCV 88.9 86.1  PLT 159 94*   Basic Metabolic Panel Recent Labs    40/98/11 0501 07/04/23 0108  NA 140 131*  K 3.2* 4.0  CL 105 102  CO2 22 20*  GLUCOSE 140* 129*  BUN 12 11  CREATININE 0.91 1.02*  CALCIUM 9.3 8.9  MG 1.3*  --    Liver Function Tests No results for input(s): "AST", "ALT", "ALKPHOS", "BILITOT", "PROT", "ALBUMIN" in the last 72 hours. No results for input(s): "LIPASE", "AMYLASE" in the last 72 hours. Cardiac Enzymes No results for input(s): "CKTOTAL", "CKMB", "CKMBINDEX", "TROPONINI" in the last 72 hours. BNP Invalid input(s): "POCBNP" D-Dimer No results for input(s): "DDIMER" in  the last 72 hours. Hemoglobin A1C No results for input(s): "HGBA1C" in the last 72 hours. Fasting Lipid Panel No results for input(s): "CHOL", "HDL", "LDLCALC", "TRIG", "CHOLHDL", "LDLDIRECT" in the last 72 hours. Thyroid Function Tests No results for input(s): "TSH", "T4TOTAL", "T3FREE", "THYROIDAB" in the last 72 hours.  Invalid input(s): "FREET3" _____________  EP PPM/ICD IMPLANT  Result Date: 07/03/2023 SURGEON:  Loman Brooklyn, MD   PREPROCEDURE DIAGNOSIS:  complete AV block   POSTPROCEDURE DIAGNOSIS:  complete AV block    PROCEDURES:  1. Pacemaker implantation.   INTRODUCTION:  EVERLEI DANBY is a 88 y.o. female with a history of bradycardia who presents today for pacemaker implantation.  The patient reports intermittent episodes of dizziness over the past few months.  No reversible causes have been identified.  The patient therefore presents today for pacemaker implantation.   DESCRIPTION OF PROCEDURE:  Informed written consent was obtained, and  the patient was brought to the electrophysiology lab in a fasting state.  The patient required no sedation for the procedure today.  The patients left chest was prepped and draped in the usual sterile fashion by the EP lab staff. The skin overlying the left deltopectoral region was infiltrated with  lidocaine for local analgesia.  A 4-cm incision was made over the left deltopectoral region.  A left subcutaneous pacemaker pocket was fashioned using a combination of sharp and blunt dissection. Electrocautery was required to assure hemostasis.  RA/RV Lead Placement: The left axillary vein was therefore cannulated.  Through the left axillary vein, a Abbott Ultipace 1231-52  (serial number  [id number]) right atrial lead and an Abbott Ultipace 1231-65 (serial number  [id number]) right ventricular lead were advanced with fluoroscopic visualization into the right atrial appendage and right ventricular apex positions respectively.  Initial atrial lead P- waves measured 2.1 mV with impedance of 794 ohms and a threshold of 0.8 V at 0.5 msec.  Right ventricular lead R-waves measured 10.6 mV with an impedance of 678 ohms and a threshold of 0.9 V at 0.5 msec.  Both leads were secured to the pectoralis fascia using #2-0 silk over the suture sleeves. Device Placement:  The leads were then connected to an Abbott Assurity [id number]  (serial number  [id number] ) pacemaker.  The pocket was irrigated with copious gentamicin solution.  The pacemaker was then placed into the pocket.  The pocket was then closed in 3 layers with 2.0 Vicryl suture for the 3.0 Vicryl suture subcutaneous and subcuticular layers.  Steri-  Strips and a sterile dressing were then applied. EBL<98ml.  There were no early apparent complications.   CONCLUSIONS:  1. Successful implantation of a Abbott Assurity [id number] dual-chamber pacemaker for symptomatic bradycardia  2. No early apparent complications.   During this procedure medications were administered to achieve and maintain moderate conscious sedation while the patient's heart rate, blood pressure, and oxygen saturation were continuously monitored and I was present face-to-face 100% of this time.   Will Elberta Fortis, MD 07/03/2023 2:25 PM  ECHOCARDIOGRAM COMPLETE  Result Date: 07/03/2023    ECHOCARDIOGRAM REPORT    Patient Name:   Krystal Chambers Date of Exam: 07/03/2023 Medical Rec #:  [id number]     Height:       58.0 in Accession #:    [id number]    Weight:       149.0 lb Date of Birth:  [birth year]    BSA:          1.607 m Patient Age:    63  years      BP:           123/94 mmHg Patient Gender: F             HR:           77 bpm. Exam Location:  Inpatient Procedure: 2D Echo, Cardiac Doppler and Color Doppler Indications:    Post TAVR evaluation V43.3/Z95.2  History:        Patient has prior history of Echocardiogram examinations, most                 recent 07/02/2023. Risk Factors:Hypertension and Dyslipidemia.  Sonographer:    Lucendia Herrlich Referring Phys: [id number] KATHRYN R THOMPSON IMPRESSIONS  1. Left ventricular ejection fraction, by estimation, is 60 to 65%. The left ventricle has normal function. The left ventricle has no regional wall motion abnormalities. There is mild left ventricular hypertrophy. Left ventricular diastolic parameters are consistent with Grade I diastolic dysfunction (impaired relaxation).  2. Right ventricular systolic function is normal. The right ventricular size is normal.  3. The mitral valve is normal in structure. Mild mitral valve regurgitation. No evidence of mitral stenosis.  4. The aortic valve has been repaired/replaced. Aortic valve regurgitation is not visualized. No aortic stenosis is present. Procedure Date: 07/02/23. Aortic valve area, by VTI measures 2.11 cm. Aortic valve mean gradient measures 6.2 mmHg. Aortic valve  Vmax measures 1.72 m/s.  5. The inferior vena cava is normal in size with greater than 50% respiratory variability, suggesting right atrial pressure of 3 mmHg. FINDINGS  Left Ventricle: Left ventricular ejection fraction, by estimation, is 60 to 65%. The left ventricle has normal function. The left ventricle has no regional wall motion abnormalities. The left ventricular internal cavity size was normal in size. There is  mild left ventricular hypertrophy. Left  ventricular diastolic parameters are consistent with Grade I diastolic dysfunction (impaired relaxation). Right Ventricle: The right ventricular size is normal. No increase in right ventricular wall thickness. Right ventricular systolic function is normal. Left Atrium: Left atrial size was normal in size. Right Atrium: Right atrial size was normal in size. Pericardium: There is no evidence of pericardial effusion. Mitral Valve: The mitral valve is normal in structure. Mild mitral valve regurgitation. No evidence of mitral valve stenosis. Tricuspid Valve: The tricuspid valve is normal in structure. Tricuspid valve regurgitation is not demonstrated. No evidence of tricuspid stenosis. Aortic Valve: The aortic valve has been repaired/replaced. Aortic valve regurgitation is not visualized. No aortic stenosis is present. Aortic valve mean gradient measures 6.2 mmHg. Aortic valve peak gradient measures 11.9 mmHg. Aortic valve area, by VTI  measures 2.11 cm. There is a 26 mm Medtronic CoreValve-Evolut Pro prosthetic, stented (TAVR) valve present in the aortic position. Pulmonic Valve: The pulmonic valve was normal in structure. Pulmonic valve regurgitation is not visualized. No evidence of pulmonic stenosis. Aorta: The aortic root is normal in size and structure. Venous: The inferior vena cava is normal in size with greater than 50% respiratory variability, suggesting right atrial pressure of 3 mmHg. IAS/Shunts: No atrial level shunt detected by color flow Doppler.  LEFT VENTRICLE PLAX 2D LVIDd:         3.50 cm   Diastology LVIDs:         2.05 cm   LV e' medial:    4.88 cm/s LV PW:         1.00 cm   LV E/e' medial:  12.5 LV IVS:  1.20 cm   LV e' lateral:   4.51 cm/s LVOT diam:     1.90 cm   LV E/e' lateral: 13.5 LV SV:         62 LV SV Index:   39 LVOT Area:     2.84 cm  RIGHT VENTRICLE             IVC RV S prime:     15.50 cm/s  IVC diam: 1.35 cm TAPSE (M-mode): 1.3 cm LEFT ATRIUM             Index        RIGHT  ATRIUM          Index LA diam:        3.20 cm 1.99 cm/m   RA Area:     5.03 cm LA Vol (A2C):   27.5 ml 17.11 ml/m  RA Volume:   6.10 ml  3.80 ml/m LA Vol (A4C):   27.8 ml 17.27 ml/m LA Biplane Vol: 27.1 ml 16.86 ml/m  AORTIC VALVE AV Area (Vmax):    2.31 cm AV Area (Vmean):   2.20 cm AV Area (VTI):     2.11 cm AV Vmax:           172.25 cm/s AV Vmean:          113.250 cm/s AV VTI:            0.294 m AV Peak Grad:      11.9 mmHg AV Mean Grad:      6.2 mmHg LVOT Vmax:         140.40 cm/s LVOT Vmean:        87.980 cm/s LVOT VTI:          0.219 m LVOT/AV VTI ratio: 0.74  AORTA Ao Root diam: 2.40 cm Ao Asc diam:  2.30 cm MITRAL VALVE                TRICUSPID VALVE MV Area (PHT): 3.03 cm     TR Peak grad:   8.6 mmHg MV Decel Time: 250 msec     TR Vmax:        147.00 cm/s MR Peak grad: 53.0 mmHg MR Vmax:      364.00 cm/s   SHUNTS MV E velocity: 61.10 cm/s   Systemic VTI:  0.22 m MV A velocity: 126.00 cm/s  Systemic Diam: 1.90 cm MV E/A ratio:  0.48 Donato Schultz MD Electronically signed by Donato Schultz MD Signature Date/Time: 07/03/2023/10:49:27 AM    Final    ECHOCARDIOGRAM LIMITED  Result Date: 07/02/2023    ECHOCARDIOGRAM LIMITED REPORT   Patient Name:   Krystal Chambers [place] Date of Exam: 07/02/2023 Medical Rec #:  [id number]     Height:       58.0 in Accession #:    [id number]    Weight:       149.0 lb Date of Birth:  Oct 13, 1935    BSA:          1.607 m Patient Age:    87 years      BP:           156/75 mmHg Patient Gender: F             HR:           74 bpm. Exam Location:  Inpatient Procedure: Limited Echo, Cardiac Doppler and Color Doppler Indications:    I35.0 Nonrheumatic aortic (valve) stenosis  History:  Patient has prior history of Echocardiogram examinations, most                 recent 04/18/2023. Aortic Valve Disease, Signs/Symptoms:Murmur                 and Edema; Risk Factors:Hypertension and Dyslipidemia. Severe                 aortic stenosis. Breast cancer.                 Aortic Valve: 26 mm  CoreValve-Evolut Pro prosthetic, stented                 (TAVR) valve is present in the aortic position.  Sonographer:    Sheralyn Boatman RDCS Referring Phys: [id number] Ly Wass D Ramelo Oetken IMPRESSIONS  1. TTE guided TAVR. 26 mm Evolut Pro. Vmax 1.7 m/s, MG 4.0 mmHG, EOA 2.44 cm2, DI 0.78. Valve appears underdeployed but no paravalvular leak noted. Normal prosthetic valve function. There is a 26 mm CoreValve-Evolut Pro prosthetic (TAVR) valve present in the aortic position. Echo findings are consistent with normal structure and function of the aortic valve prosthesis.  2. Left ventricular ejection fraction, by estimation, is 70 to 75%. The left ventricle has hyperdynamic function. The left ventricle has no regional wall motion abnormalities.  3. Right ventricular systolic function is normal. The right ventricular size is normal.  4. The mitral valve is degenerative. Mild mitral valve regurgitation. No evidence of mitral stenosis. FINDINGS  Left Ventricle: Left ventricular ejection fraction, by estimation, is 70 to 75%. The left ventricle has hyperdynamic function. The left ventricle has no regional wall motion abnormalities. Right Ventricle: The right ventricular size is normal. No increase in right ventricular wall thickness. Right ventricular systolic function is normal. Left Atrium: Left atrial size was normal in size. Right Atrium: Right atrial size was normal in size. Pericardium: There is no evidence of pericardial effusion. Mitral Valve: The mitral valve is degenerative in appearance. Mild mitral valve regurgitation. No evidence of mitral valve stenosis. Tricuspid Valve: The tricuspid valve is normal in structure. Tricuspid valve regurgitation is mild. Aortic Valve: TTE guided TAVR. 26 mm Evolut Pro. Vmax 1.7 m/s, MG 4.0 mmHG, EOA 2.44 cm2, DI 0.78. Valve appears underdeployed but no paravalvular leak noted. Normal prosthetic valve function. Aortic valve mean gradient measures 4.0 mmHg. Aortic valve peak gradient  measures 11.3 mmHg. Aortic valve area, by VTI measures 2.44 cm. There is a 26 mm CoreValve-Evolut Pro prosthetic, stented (TAVR) valve present in the aortic position. Echo findings are consistent with normal structure and function of the aortic valve prosthesis. Aorta: The aortic root and ascending aorta are structurally normal, with no evidence of dilitation. IAS/Shunts: The atrial septum is grossly normal. Additional Comments: Spectral Doppler performed. Color Doppler performed.  LEFT VENTRICLE PLAX 2D LVOT diam:     2.00 cm LV SV:         61 LV SV Index:   38 LVOT Area:     3.14 cm  LV Volumes (MOD) LV vol d, MOD A2C: 80.1 ml LV vol d, MOD A4C: 49.6 ml LV vol s, MOD A2C: 19.5 ml LV vol s, MOD A4C: 12.4 ml LV SV MOD A2C:     60.6 ml LV SV MOD A4C:     49.6 ml LV SV MOD BP:      47.0 ml AORTIC VALVE AV Area (Vmax):    2.42 cm AV Area (Vmean):   3.18 cm AV Area (VTI):  2.44 cm AV Vmax:           168.00 cm/s AV Vmean:          83.800 cm/s AV VTI:            0.250 m AV Peak Grad:      11.3 mmHg AV Mean Grad:      4.0 mmHg LVOT Vmax:         129.67 cm/s LVOT Vmean:        84.919 cm/s LVOT VTI:          0.194 m LVOT/AV VTI ratio: 0.78  SHUNTS Systemic VTI:  0.19 m Systemic Diam: 2.00 cm Lennie Odor MD Electronically signed by Lennie Odor MD Signature Date/Time: 07/02/2023/12:56:34 PM    Final    Structural Heart Procedure  Result Date: 07/02/2023 See surgical note for result.  DG Chest 2 View  Result Date: 06/30/2023 CLINICAL DATA:  Preop exam.  Severe aortic stenosis. EXAM: CHEST - 2 VIEW COMPARISON:  Chest CT 05/13/2023 FINDINGS: The heart is normal in size. Aortic tortuosity. Normal pulmonary vasculature. No pleural effusion, pneumothorax, or focal airspace disease. Right axillary surgical clips. Bilateral shoulder arthropathy. IMPRESSION: No acute chest findings. Electronically Signed   By: Narda Rutherford M.D.   On: 06/30/2023 16:55   Disposition   Pt is being discharged home today in good  condition.  Follow-up Plans & Appointments     Follow-up Information     Janetta Hora, PA-C. Go on 07/12/2023.   Specialties: Cardiology, Radiology Why: @ 9:50am. please arrive at least 10 min early. Contact information: [address] [place] [address] [phone]                Discharge Instructions     Amb Referral to Cardiac Rehabilitation   Complete by: As directed    Diagnosis: Valve Replacement   Valve: Aortic   After initial evaluation and assessments completed: Virtual Based Care may be provided alone or in conjunction with Phase 2 Cardiac Rehab based on patient barriers.: Yes   Intensive Cardiac Rehabilitation (ICR) [facility] location only OR Traditional Cardiac Rehabilitation (TCR) *If criteria for ICR are not met will enroll in TCR [facility] only): Yes       Discharge Medications   Allergies as of 07/04/2023   No Known Allergies      Medication List     TAKE these medications    acetaminophen 325 MG tablet Commonly known as: TYLENOL Take 2 tablets (650 mg total) by mouth every 6 (six) hours as needed for mild pain (or Fever >/= 101).   amLODipine 10 MG tablet Commonly known as: NORVASC Take 10 mg by mouth in the morning.   aspirin EC 81 MG tablet Take 1 tablet (81 mg total) by mouth daily. Swallow whole.   carvedilol 6.25 MG tablet Commonly known as: COREG Take 6.25 mg by mouth 2 (two) times daily with a meal.   Crestor 20 MG tablet Generic drug: rosuvastatin Take 20 mg by mouth in the morning.   furosemide 40 MG tablet Commonly known as: LASIX Take 0.5 tablets (20 mg total) by mouth as needed. What changed:  how much to take when to take this   hydrochlorothiazide 25 MG tablet Commonly known as: HYDRODIURIL Take 25 mg by mouth every morning.   lisinopril 40 MG tablet Commonly known as: ZESTRIL Take 40 mg by mouth in the morning.   multivitamin capsule Take 1 capsule by mouth in the  morning.   Vitamin D3 50  MCG (2000 UT) capsule Take 2,000 Units by mouth in the morning.          Outstanding Labs/Studies   none  Duration of Discharge Encounter   Greater than 30 minutes including physician time.  Byrd Hesselbach, PA-C 07/04/2023, 9:37 AM [phone]  I have personally seen and examined this patient. I agree with the assessment and plan as outlined above.  Pt doing well today post pacemaker implantation.  Groins stable.  Will d/c home today.   Verne Carrow, MD, [facility] 07/04/2023 9:49 AM

## 2023-07-02 NOTE — Transfer of Care (Signed)
Immediate Anesthesia Transfer of Care Note  Patient: Krystal Chambers  Procedure(s) Performed: Transcatheter Aortic Valve Replacement, Transfemoral INTRAOPERATIVE TRANSTHORACIC ECHOCARDIOGRAM  Patient Location: Cath Lab  Anesthesia Type:MAC  Level of Consciousness: awake, alert , and oriented  Airway & Oxygen Therapy: Patient Spontanous Breathing  Post-op Assessment: Report given to RN and Post -op Vital signs reviewed and stable  Post vital signs: Reviewed and stable  Last Vitals:  Vitals Value Taken Time  BP 113/66 07/02/23 1222  Temp 98   Pulse 60 07/02/23 1233  Resp 24 07/02/23 1233  SpO2 98 % 07/02/23 1233  Vitals shown include unfiled device data.  Last Pain:  Vitals:   07/02/23 0718  TempSrc:   PainSc: 0-No pain      Patients Stated Pain Goal: 0 (07/02/23 0718)  Complications: No notable events documented.

## 2023-07-02 NOTE — Discharge Instructions (Signed)
ACTIVITY AND EXERCISE  Daily activity and exercise are an important part of your recovery. People recover at different rates depending on their general health and type of valve procedure.  Most people recovering from TAVR feel better relatively quickly   No lifting, pushing, pulling more than 10 pounds (examples to avoid: groceries, vacuuming, gardening, golfing):             - For one week with a procedure through the groin.             - For six weeks for procedures through the chest wall or neck. NOTE: You will typically see one of our providers 7-14 days after your procedure to discuss WHEN TO RESUME the above activities.      DRIVING  Do not drive until you are seen for follow up and cleared by a provider. Generally, we ask patient to not drive for 1 week after their procedure.  If you have been told by your doctor in the past that you may not drive, you must talk with him/her before you begin driving again.   DRESSING  Groin site: you may leave the clear dressing over the site for up to one week or until it falls off.   HYGIENE  If you had a femoral (leg) procedure, you may take a shower when you return home. After the shower, pat the site dry. Do NOT use powder, oils or lotions in your groin area until the site has completely healed.  If you had a chest procedure, you may shower when you return home unless specifically instructed not to by your discharging practitioner.             - DO NOT scrub incision; pat dry with a towel.             - DO NOT apply any lotions, oils, powders to the incision.             - No tub baths / swimming for at least 2 weeks.  If you notice any fevers, chills, increased pain, swelling, bleeding or pus, please contact your doctor.   ADDITIONAL INFORMATION  If you are going to have an upcoming dental procedure, please contact our office as you will require antibiotics ahead of time to prevent infection on your heart valve.    If you have any questions  or concerns you can call the structural heart phone during normal business hours 8am-4pm. If you have an urgent need after hours or weekends please call [phone] to talk to the on call provider for general cardiology. If you have an emergency that requires immediate attention, please call 911.    After TAVR Checklist  Check  Test Description   Follow up appointment in 1-2 weeks  You will see our structural heart advanced practice provider. Your incision sites will be checked and you will be cleared to drive and resume all normal activities if you are doing well.     1 month echo and follow up  You will have an echo to check on your new heart valve and be seen back in the office by a structural heart advanced practice provider.   Follow up with your primary cardiologist You will need to be seen by your primary cardiologist in the following 3-6 months after your 1 month appointment in the valve clinic.    1 year echo and follow up You will have another echo to check on your heart valve after 1 year  and be seen back in the office by a structural heart advanced practice provider. This your last structural heart visit.   Bacterial endocarditis prophylaxis  You will have to take antibiotics for the rest of your life before all dental procedures (even teeth cleanings) to protect your heart valve. Antibiotics are also required before some surgeries. Please check with your cardiologist before scheduling any surgeries. Also, please make sure to tell us if you have a penicillin allergy as you will require an alternative antibiotic.    After Your Pacemaker   You have a Abbott Pacemaker  ACTIVITY Do not lift your arm above shoulder height for 1 week after your procedure. After 7 days, you may progress as below.  You should remove your sling 24 hours after your procedure, unless otherwise instructed by your provider.     Thursday July 11, 2023  Friday July 12, 2023 Saturday July 13, 2023 Sunday  July 14, 2023   Do not lift, push, pull, or carry anything over 10 pounds with the affected arm until 6 weeks (Thursday August 15, 2023 ) after your procedure.   You may drive AFTER your wound check, unless you have been told otherwise by your provider.   Ask your healthcare provider when you can go back to work   INCISION/Dressing If you are on a blood thinner such as Coumadin, Xarelto, Eliquis, Plavix, or Pradaxa please confirm with your provider when this should be resumed.   If large square, outer bandage is left in place, this can be removed after 24 hours from your procedure. Do not remove steri-strips or glue as below.   If a PRESSURE DRESSING (a bulky dressing that usually goes up over your shoulder) was applied or left in place, please follow instructions given by your provider on when to return to have this removed.   Monitor your Pacemaker site for redness, swelling, and drainage. Call the device clinic at [phone] if you experience these symptoms or fever/chills.  If your incision is sealed with Steri-strips or staples, you may shower 7 days after your procedure or when told by your provider. Do not remove the steri-strips or let the shower hit directly on your site. You may wash around your site with soap and water.    If you were discharged in a sling, please do not wear this during the day more than 48 hours after your surgery unless otherwise instructed. This may increase the risk of stiffness and soreness in your shoulder.   Avoid lotions, ointments, or perfumes over your incision until it is well-healed.  You may use a hot tub or a pool AFTER your wound check appointment if the incision is completely closed.  Pacemaker Alerts:  Some alerts are vibratory and others beep. These are NOT emergencies. Please call our office to let us know. If this occurs at night or on weekends, it can wait until the next business day. Send a remote transmission.  If your device is  capable of reading fluid status (for heart failure), you will be offered monthly monitoring to review this with you.   DEVICE MANAGEMENT Remote monitoring is used to monitor your pacemaker from home. This monitoring is scheduled every 91 days by our office. It allows [place] to keep an eye on the functioning of your device to ensure it is working properly. You will routinely see your Electrophysiologist annually (more often if necessary).   You should receive your ID card for your new device in 4-8 weeks. Keep  this card with you at all times once received. Consider wearing a medical alert bracelet or necklace.  Your Pacemaker may be MRI compatible. This will be discussed at your next office visit/wound check.  You should avoid contact with strong electric or magnetic fields.   Do not use amateur (ham) radio equipment or electric (arc) welding torches. MP3 player headphones with magnets should not be used. Some devices are safe to use if held at least 12 inches (30 cm) from your Pacemaker. These include power tools, lawn mowers, and speakers. If you are unsure if something is safe to use, ask your health care provider.  When using your cell phone, hold it to the ear that is on the opposite side from the Pacemaker. Do not leave your cell phone in a pocket over the Pacemaker.  You may safely use electric blankets, heating pads, computers, and microwave ovens.  Call the office right away if: You have chest pain. You feel more short of breath than you have felt before. You feel more light-headed than you have felt before. Your incision starts to open up.  This information is not intended to replace advice given to you by your health care provider. Make sure you discuss any questions you have with your health care provider.

## 2023-07-02 NOTE — Op Note (Signed)
[facility]   MULTIDISCIPLINARY HEART VALVE TEAM     TAVR OPERATIVE NOTE   NAME@ [id number]  Date of Procedure:                 07/02/2023   Preoperative Diagnosis:      Severe Aortic Stenosis    Postoperative Diagnosis:    Same    Procedure:        Transcatheter Aortic Valve Replacement - Percutaneous Right Transfemoral Approach             Medtronic Evolut FX  (size 26 mm, serial # [id number])              Co-Surgeons:            Alleen Borne, MD and Verne Carrow, MD     Anesthesiologist:                  Hyman Bower, MD   Echocardiographer:              Lacretia Nicks. O'Neal, MD   Pre-operative Echo Findings: Severe aortic stenosis  Normal left ventricular systolic function   Post-operative Echo Findings: No paravalvular leak Normal left ventricular systolic function      BRIEF CLINICAL NOTE AND INDICATIONS FOR SURGERY    This 88 year old woman has stage D, severe, symptomatic aortic stenosis with NYHA class II symptoms of exertional fatigue and shortness of breath consistent with chronic diastolic congestive heart failure.  I have personally reviewed her 2D echocardiogram, cardiac catheterization, and CTA studies.  Her echocardiogram shows a trileaflet aortic valve with severe calcification and thickening and restricted leaflet mobility.  The mean gradient is 51 mmHg with a valve area of 0.89 cm consistent with severe aortic stenosis.  There is mild regurgitation.  Left ventricular ejection fraction is 60 to 65%.  Cardiac catheterization showed no significant coronary disease with normal filling pressures.  I agree that aortic valve replacement is indicated in this patient for relief of her symptoms and to prevent left ventricular deterioration.  Given her advanced age I think transcatheter aortic valve replacement would be the best option for treating her.  Her gated cardiac CTA shows anatomy suitable for TAVR using a 26 mm Evolut FX valve.  Her abdominal and  pelvic CTA shows adequate pelvic vascular anatomy to allow transfemoral insertion.   The patient and her daughter were counseled at length regarding treatment alternatives for management of severe symptomatic aortic stenosis. The risks and benefits of surgical intervention has been discussed in detail. Long-term prognosis with medical therapy was discussed. Alternative approaches such as conventional surgical aortic valve replacement, transcatheter aortic valve replacement, and palliative medical therapy were compared and contrasted at length. This discussion was placed in the context of the patient's own specific clinical presentation and past medical history. All of their questions have been addressed.    Following the decision to proceed with transcatheter aortic valve replacement, a discussion was held regarding what types of management strategies would be attempted intraoperatively in the event of life-threatening complications, including whether or not the patient would be considered a candidate for the use of cardiopulmonary bypass and/or conversion to open sternotomy for attempted surgical intervention.  Given her advanced age and comorbid risk factors I do not think she is a candidate for emergent sternotomy to manage any intraoperative complications.  The patient has been advised of a variety of complications that might develop including but not limited to risks of death, stroke,  paravalvular leak, aortic dissection or other major vascular complications, aortic annulus rupture, device embolization, cardiac rupture or perforation, mitral regurgitation, acute myocardial infarction, arrhythmia, heart block or bradycardia requiring permanent pacemaker placement, congestive heart failure, respiratory failure, renal failure, pneumonia, infection, other late complications related to structural valve deterioration or migration, or other complications that might ultimately cause a temporary or permanent loss of  functional independence or other long term morbidity. The patient provides full informed consent for the procedure as described and all questions were answered.          DETAILS OF THE OPERATIVE PROCEDURE    PREPARATION:    The patient was brought to the operating room on the above mentioned date and appropriate monitoring was established by the anesthesia team. The patient was placed in the supine position on the operating table.  Intravenous antibiotics were administered. The patient was monitored closely throughout the procedure under conscious sedation.   Baseline transthoracic echocardiogram was performed. The patient's abdomen and both groins were prepped and draped in a sterile manner. A time out procedure was performed.   PERIPHERAL ACCESS:    Using the modified Seldinger technique, femoral arterial and venous access was obtained with placement of 6 Fr sheaths on the left side.  A pigtail diagnostic catheter was passed through the left arterial sheath under fluoroscopic guidance into the aortic root.  A temporary transvenous pacemaker catheter was passed through the left femoral venous sheath under fluoroscopic guidance into the right ventricle.  The pacemaker was tested to ensure stable lead placement and pacemaker capture. Aortic root angiography was performed in order to determine the optimal angiographic angle for valve deployment.    TRANSFEMORAL ACCESS:    Percutaneous transfemoral access and sheath placement was performed using ultrasound guidance.  The right common femoral artery was cannulated using a micropuncture needle.  A pair of Abbott Perclose percutaneous closure devices were placed and a 6 French sheath replaced into the femoral artery.  The patient was heparinized systemically and ACT verified > 250 seconds.     An 18 Fr transfemoral Gore Dry-Seal sheath was introduced into the right femoral artery after progressively dilating over an Amplatz superstiff wire. An AL-1  catheter was used to direct a straight-tip exchange length wire across the native aortic valve into the left ventricle. This was exchanged out for a pigtail catheter and position was confirmed in the LV apex. Simultaneous LV and Ao pressures were recorded.  The pigtail catheter was exchanged for a Safari wire in the LV apex. As soon as the [facility] wire was passed into the LV there was development of complete heart block and the patient was paced throughout the remainder of the procedure.   BALLOON AORTIC VALVULOPLASTY:    Performed using an 18 mm True balloon while rapid ventricular pacing.   TRANSCATHETER HEART VALVE DEPLOYMENT:    A Medtronic Evolut FX transcatheter heart valve (size 26 mm) was prepared and loaded into the delivery catheter system per manufacturer's guidelines and the proper orientation of the valve is confirmed under fluoroscopy. The delivery system and inline sheath were inserted into the right common femoral artery over the [facility] wire and the inline sheath advanced into the abdominal aorta under fluoroscopic guidance. The delivery catheter was advanced around the aortic arch and the valve was carefully positioned across the aortic valve annulus. An aortic root injection was performed to confirm position and the valve deployed using cusp overlap technique under fluoroscopic guidance. Intermittent pacing was used during valve deployment.  The delivery system and guidewire were retracted into the descending aorta and the nosecone re-sheathed. Valve function was assessed using echocardiography. There is felt to be no paravalvular leak and no central aortic insufficiency. The patient's hemodynamic recovery following valve deployment is good.        PROCEDURE COMPLETION:    The delivery system and in-line sheath were removed and femoral artery closure performed using the Perclose devices.  Protamine was administered once femoral arterial repair was complete. The temporary pacemaker and  femoral venous and arterial sheaths were left in place.   The patient tolerated the procedure well and is transported to the cath lab recovery area in stable condition. There were no immediate intraoperative complications. All sponge instrument and needle counts are verified correct at completion of the operation.    No blood products were administered during the operation.     Alleen Borne, MD

## 2023-07-02 NOTE — Progress Notes (Signed)
  [facility]   MULTIDISCIPLINARY HEART VALVE TEAM  Patient doing well s/p TAVR. She is hemodynamically stable. Groin sites stable. Temp pacing wire in place with underlying conduction noted on telemetry. HRs in the mid 60's. Plan for EP consult tomorrow to determine if PPM indicated. Continue to hold beta blocker.    Krystal Chard NP-C Structural Heart Team  Pager: [phone] Phone: [phone]

## 2023-07-02 NOTE — Interval H&P Note (Signed)
History and Physical Interval Note:  07/02/2023 9:44 AM  Krystal Chambers  has presented today for surgery, with the diagnosis of Severe Aortic Stenosis.  The various methods of treatment have been discussed with the patient and family. After consideration of risks, benefits and other options for treatment, the patient has consented to  Procedure(s): Transcatheter Aortic Valve Replacement, Transfemoral (N/A) INTRAOPERATIVE TRANSTHORACIC ECHOCARDIOGRAM (N/A) as a surgical intervention.  The patient's history has been reviewed, patient examined, no change in status, stable for surgery.  I have reviewed the patient's chart and labs.  Questions were answered to the patient's satisfaction.     Alleen Borne

## 2023-07-02 NOTE — CV Procedure (Signed)
[facility]  TAVR OPERATIVE NOTE   Date of Procedure:  07/02/2023  Preoperative Diagnosis: Severe Aortic Stenosis   Postoperative Diagnosis: Same   Procedure:   Transcatheter Aortic Valve Replacement - Transfemoral Approach  Medtronic Evolut Pro THV (size 26 mm, model # [id number], serial #  [id number] )   Co-Surgeons:  Verne Carrow, MD and Alleen Borne, MD   Anesthesiologist:  Bradley Ferris  Echocardiographer:  O'Neal  Pre-operative Echo Findings: Severe aortic stenosis Normal left ventricular systolic function  Post-operative Echo Findings: No paravalvular leak Normal left ventricular systolic function  BRIEF CLINICAL NOTE AND INDICATIONS FOR SURGERY  88 yo female with history of HTN, hyperlipidemia, thyroid disease, breast cancer and severe aortic valve stenosis. Echo 04/18/23 with LVEF=60-65%. Severe LVH. Normal RV function. Mild mitral regurgitation. Severe aortic stenosis with mean gradient 51 mmHg, AVA 0.9 cm2, DI 0.28. Mild AI.   During the course of the patient's preoperative work up they have been evaluated comprehensively by a multidisciplinary team of specialists coordinated through the Multidisciplinary [facility] Valve Clinic in the [facility].  They have been demonstrated to suffer from symptomatic severe aortic stenosis as noted above. The patient has been counseled extensively as to the relative risks and benefits of all options for the treatment of severe aortic stenosis including long term medical therapy, conventional surgery for aortic valve replacement, and transcatheter aortic valve replacement.  The patient has been independently evaluated by Dr. Laneta Simmers with CT surgery and they are felt to be at high risk for conventional surgical aortic valve replacement. The surgeon indicated the patient would be a poor candidate for conventional surgery. Based upon review of all of the patient's preoperative diagnostic tests they are  felt to be candidate for transcatheter aortic valve replacement using the transfemoral approach as an alternative to high risk conventional surgery.    Following the decision to proceed with transcatheter aortic valve replacement, a discussion has been held regarding what types of management strategies would be attempted intraoperatively in the event of life-threatening complications, including whether or not the patient would be considered a candidate for the use of cardiopulmonary bypass and/or conversion to open sternotomy for attempted surgical intervention.  The patient has been advised of a variety of complications that might develop peculiar to this approach including but not limited to risks of death, stroke, paravalvular leak, aortic dissection or other major vascular complications, aortic annulus rupture, device embolization, cardiac rupture or perforation, acute myocardial infarction, arrhythmia, heart block or bradycardia requiring permanent pacemaker placement, congestive heart failure, respiratory failure, renal failure, pneumonia, infection, other late complications related to structural valve deterioration or migration, or other complications that might ultimately cause a temporary or permanent loss of functional independence or other long term morbidity.  The patient provides full informed consent for the procedure as described and all questions were answered preoperatively.    DETAILS OF THE OPERATIVE PROCEDURE  PREPARATION:   The patient is brought to the operating room on the above mentioned date and central monitoring was established by the anesthesia team including placement of a radial arterial line. The patient is placed in the supine position on the operating table.  Intravenous antibiotics are administered. Conscious sedation is used.   Baseline transthoracic echocardiogram was performed. The patient's chest, abdomen, both groins, and both lower extremities are prepared and  draped in a sterile manner. A time out procedure is performed.   PERIPHERAL ACCESS:   Using the modified Seldinger technique, femoral arterial  and venous access were obtained with placement of a 6 Fr sheath in the artery and a 7 Fr sheath in the vein on the left side using u/s guidance.  A pigtail diagnostic catheter was passed through the femoral arterial sheath under fluoroscopic guidance into the aortic root.  A temporary transvenous pacemaker catheter was passed through the femoral venous sheath under fluoroscopic guidance into the right ventricle.  The pacemaker was tested to ensure stable lead placement and pacemaker capture. Aortic root angiography was performed in order to determine the optimal angiographic angle for valve deployment.  TRANSFEMORAL ACCESS:  A micropuncture kit was used to gain access to the right femoral artery using u/s guidance. Position confirmed with angiography. Pre-closure with double ProGlide closure devices. The patient was heparinized systemically and ACT verified > 250 seconds.    A 18 Fr transfemoral Dry Seal sheath was introduced into the right femoral artery over an Amplatz superstiff wire. An AL-1 catheter was used to direct a straight-tip exchange length wire across the native aortic valve into the left ventricle. This was exchanged out for a pigtail catheter and position was confirmed in the LV apex. Simultaneous LV and Ao pressures were recorded.  The pigtail catheter was then exchanged for an Confida wire in the LV apex.   TRANSCATHETER HEART VALVE DEPLOYMENT:  A Medtronic Evolut Pro THV size 26 mm was prepared and per manufacturer's guidelines, and the proper orientation of the valve is confirmed on the delivery system.  The valve was advanced through the introducer sheath into the descending aorta. The valve was then advanced across the aortic arch. The valve was carefully positioned across the aortic valve annulus. Once final position of the valve has been  confirmed with angiographic assessment in the cusp overlap view and in the LAO view, the valve is deployed with controlled rapid pacing. The valve is taken to 80% deployment and appropriate depth is confirmed. The valve is then released from each paddle. There is no valvular leak and no central aortic insufficiency.  The patient's hemodynamic recovery following valve deployment is good.  Echo demostrated acceptable post-procedural gradients, stable mitral valve function, and no AI.   PROCEDURE COMPLETION:  The sheath was then removed and closure devices were completed. Protamine was administered once femoral arterial repair was complete.  The patient had complete heart block prior to valve deployment. The transvenous pacing wire was left in place. The left femoral arterial sheath was left in place as the patient was requiring Levophed for pressure support.     The patient tolerated the procedure well and is transported to the surgical intensive care in stable condition. There were no immediate intraoperative complications. All sponge instrument and needle counts are verified correct at completion of the operation.   No blood products were administered during the operation.  LVEDP=17 mmHg  Verne Carrow MD 07/02/2023 12:02 PM

## 2023-07-02 NOTE — Progress Notes (Signed)
  Echocardiogram 2D Echocardiogram has been performed.  Janalyn Harder 07/02/2023, 11:49 AM

## 2023-07-02 NOTE — Anesthesia Postprocedure Evaluation (Signed)
Anesthesia Post Note  Patient: Krystal Chambers  Procedure(s) Performed: Transcatheter Aortic Valve Replacement, Transfemoral INTRAOPERATIVE TRANSTHORACIC ECHOCARDIOGRAM     Patient location during evaluation: Cath Lab Anesthesia Type: MAC Level of consciousness: awake Pain management: pain level controlled Vital Signs Assessment: post-procedure vital signs reviewed and stable Respiratory status: spontaneous breathing, nonlabored ventilation and respiratory function stable Cardiovascular status: blood pressure returned to baseline and stable Postop Assessment: no apparent nausea or vomiting Anesthetic complications: no   There were no known notable events for this encounter.  Last Vitals:  Vitals:   07/02/23 1445 07/02/23 1500  BP:  (!) 154/70  Pulse: 66 67  Resp: 11 17  Temp:    SpO2: 97% 98%    Last Pain:  Vitals:   07/02/23 1306  TempSrc: Oral  PainSc:                  Adia Crammer P Sheralee Qazi

## 2023-07-02 NOTE — Telephone Encounter (Signed)
Error

## 2023-07-02 NOTE — Anesthesia Preprocedure Evaluation (Addendum)
Anesthesia Evaluation  Patient identified by MRN, date of birth, ID band Patient awake    Reviewed: Allergy & Precautions, NPO status , Patient's Chart, lab work & pertinent test results  Airway Mallampati: III  TM Distance: >3 FB Neck ROM: Full    Dental  (+) Edentulous Upper, Edentulous Lower   Pulmonary neg pulmonary ROS   Pulmonary exam normal        Cardiovascular hypertension, Pt. on medications and Pt. on home beta blockers + Valvular Problems/Murmurs AS  Rhythm:Regular Rate:Normal + Systolic murmurs    Neuro/Psych negative neurological ROS  negative psych ROS   GI/Hepatic negative GI ROS, Neg liver ROS,,,  Endo/Other  negative endocrine ROS    Renal/GU Renal disease     Musculoskeletal  (+) Arthritis ,    Abdominal  (+) + obese  Peds  Hematology negative hematology ROS (+)   Anesthesia Other Findings Severe Aortic Stenosis  Reproductive/Obstetrics                             Anesthesia Physical Anesthesia Plan  ASA: 4  Anesthesia Plan: MAC   Post-op Pain Management:    Induction: Intravenous  PONV Risk Score and Plan: 2 and Ondansetron, Dexamethasone and Treatment may vary due to age or medical condition  Airway Management Planned: Simple Face Mask  Additional Equipment:   Intra-op Plan:   Post-operative Plan:   Informed Consent: I have reviewed the patients History and Physical, chart, labs and discussed the procedure including the risks, benefits and alternatives for the proposed anesthesia with the patient or authorized representative who has indicated his/her understanding and acceptance.     Dental advisory given  Plan Discussed with: CRNA  Anesthesia Plan Comments:        Anesthesia Quick Evaluation

## 2023-07-03 ENCOUNTER — Encounter (HOSPITAL_COMMUNITY): Payer: Self-pay | Admitting: Cardiovascular Disease

## 2023-07-03 ENCOUNTER — Other Ambulatory Visit: Payer: Self-pay

## 2023-07-03 ENCOUNTER — Inpatient Hospital Stay (HOSPITAL_COMMUNITY): Payer: Medicare Other

## 2023-07-03 ENCOUNTER — Encounter (HOSPITAL_COMMUNITY)
Admission: RE | Disposition: A | Discharge status: Home / Self Care | Payer: Self-pay | Source: Home / Self Care | Attending: Cardiovascular Disease

## 2023-07-03 DIAGNOSIS — Z952 Presence of prosthetic heart valve: Secondary | ICD-10-CM

## 2023-07-03 DIAGNOSIS — I442 Atrioventricular block, complete: Secondary | ICD-10-CM | POA: Diagnosis not present

## 2023-07-03 DIAGNOSIS — I350 Nonrheumatic aortic (valve) stenosis: Secondary | ICD-10-CM | POA: Diagnosis not present

## 2023-07-03 DIAGNOSIS — Z006 Encounter for examination for normal comparison and control in clinical research program: Secondary | ICD-10-CM | POA: Diagnosis not present

## 2023-07-03 DIAGNOSIS — E669 Obesity, unspecified: Secondary | ICD-10-CM | POA: Diagnosis not present

## 2023-07-03 HISTORY — PX: PACEMAKER IMPLANT: EP1218

## 2023-07-03 SURGERY — PACEMAKER IMPLANT

## 2023-07-03 MED ORDER — ATROPINE SULFATE 1 MG/10ML IJ SOSY
PREFILLED_SYRINGE | INTRAMUSCULAR | Status: AC
  Filled 2023-07-03: qty 10

## 2023-07-03 MED ORDER — POTASSIUM CHLORIDE CRYS ER 20 MEQ PO TBCR
20.0000 meq | EXTENDED_RELEASE_TABLET | Freq: Once | ORAL | Status: AC
  Administered 2023-07-03: 20 meq via ORAL
  Filled 2023-07-03: qty 1

## 2023-07-03 MED ORDER — MIDAZOLAM HCL 5 MG/5ML IJ SOLN
INTRAMUSCULAR | Status: DC | PRN
  Administered 2023-07-03: 1 mg via INTRAVENOUS

## 2023-07-03 MED ORDER — LIDOCAINE HCL 1 % IJ SOLN
INTRAMUSCULAR | Status: AC
  Filled 2023-07-03: qty 60

## 2023-07-03 MED ORDER — POTASSIUM CHLORIDE CRYS ER 20 MEQ PO TBCR: 40.0000 meq | EXTENDED_RELEASE_TABLET | Freq: Once | ORAL | Status: DC

## 2023-07-03 MED ORDER — HEPARIN (PORCINE) IN NACL 1000-0.9 UT/500ML-% IV SOLN
INTRAVENOUS | Status: DC | PRN
  Administered 2023-07-03: 500 mL

## 2023-07-03 MED ORDER — MAGNESIUM SULFATE 4 GM/100ML IV SOLN
4.0000 g | Freq: Once | INTRAVENOUS | Status: AC
  Administered 2023-07-03: 4 g via INTRAVENOUS
  Filled 2023-07-03: qty 100

## 2023-07-03 MED ORDER — CEFAZOLIN SODIUM-DEXTROSE 2-4 GM/100ML-% IV SOLN
INTRAVENOUS | Status: AC
  Administered 2023-07-03: 2 g
  Filled 2023-07-03: qty 100

## 2023-07-03 MED ORDER — ORAL CARE MOUTH RINSE: 15.0000 mL | OROMUCOSAL | Status: DC | PRN

## 2023-07-03 MED ORDER — MAGNESIUM SULFATE 2 GM/50ML IV SOLN
2.0000 g | Freq: Once | INTRAVENOUS | Status: AC
  Administered 2023-07-03: 2 g via INTRAVENOUS
  Filled 2023-07-03: qty 50

## 2023-07-03 MED ORDER — ASPIRIN 81 MG PO TBEC
81.0000 mg | DELAYED_RELEASE_TABLET | Freq: Every day | ORAL | Status: DC
  Administered 2023-07-03 – 2023-07-04 (×2): 81 mg via ORAL
  Filled 2023-07-03 (×2): qty 1

## 2023-07-03 MED ORDER — POTASSIUM CHLORIDE CRYS ER 20 MEQ PO TBCR
40.0000 meq | EXTENDED_RELEASE_TABLET | Freq: Once | ORAL | Status: AC
  Administered 2023-07-03: 40 meq via ORAL
  Filled 2023-07-03: qty 2

## 2023-07-03 MED ORDER — MAGNESIUM OXIDE -MG SUPPLEMENT 400 (240 MG) MG PO TABS
400.0000 mg | ORAL_TABLET | Freq: Once | ORAL | Status: DC
  Filled 2023-07-03: qty 1

## 2023-07-03 MED ORDER — LIDOCAINE HCL (PF) 1 % IJ SOLN
INTRAMUSCULAR | Status: AC
  Filled 2023-07-03: qty 30

## 2023-07-03 MED ORDER — MIDAZOLAM HCL 5 MG/5ML IJ SOLN
INTRAMUSCULAR | Status: AC
  Filled 2023-07-03: qty 5

## 2023-07-03 MED ORDER — CEFAZOLIN SODIUM-DEXTROSE 1-4 GM/50ML-% IV SOLN
1.0000 g | Freq: Four times a day (QID) | INTRAVENOUS | Status: AC
  Administered 2023-07-03 – 2023-07-04 (×3): 1 g via INTRAVENOUS
  Filled 2023-07-03 (×3): qty 50

## 2023-07-03 MED ORDER — HYDROCHLOROTHIAZIDE 25 MG PO TABS
25.0000 mg | ORAL_TABLET | Freq: Every morning | ORAL | Status: DC
  Administered 2023-07-03 – 2023-07-04 (×2): 25 mg via ORAL
  Filled 2023-07-03 (×2): qty 1

## 2023-07-03 MED ORDER — FENTANYL CITRATE (PF) 100 MCG/2ML IJ SOLN
INTRAMUSCULAR | Status: AC
  Filled 2023-07-03: qty 2

## 2023-07-03 MED ORDER — FENTANYL CITRATE (PF) 100 MCG/2ML IJ SOLN
INTRAMUSCULAR | Status: DC | PRN
  Administered 2023-07-03: 25 ug via INTRAVENOUS

## 2023-07-03 MED ORDER — LIDOCAINE HCL (PF) 1 % IJ SOLN
INTRAMUSCULAR | Status: DC | PRN
  Administered 2023-07-03: 50 mL

## 2023-07-03 MED ORDER — SODIUM CHLORIDE 0.9 % IV SOLN
INTRAVENOUS | Status: AC
  Administered 2023-07-03: 80 mg
  Filled 2023-07-03: qty 2

## 2023-07-03 MED FILL — Fentanyl Citrate Preservative Free (PF) Inj 100 MCG/2ML: INTRAMUSCULAR | Qty: 0.5 | Status: AC

## 2023-07-03 SURGICAL SUPPLY — 14 items
CABLE SURGICAL S-101-97-12 (CABLE) ×1 IMPLANT
CATH CPS LOCATOR 3D MED (CATHETERS) IMPLANT
CATH CPS LOCATOR 3D SM (CATHETERS) IMPLANT
GUIDEWIRE VASC J-TIP .035X150 (WIRE) IMPLANT
LEAD ULTIPACE 52 LPA1231/52 (Lead) IMPLANT
LEAD ULTIPACE 65 LPA1231/65 (Lead) IMPLANT
MAT PREVALON FULL STRYKER (MISCELLANEOUS) IMPLANT
PACEMAKER ASSURITY DR-RF (Pacemaker) IMPLANT
PAD DEFIB RADIO PHYSIO CONN (PAD) ×1 IMPLANT
SHEATH 7FR PRELUDE SNAP 13 (SHEATH) IMPLANT
SHEATH 9FR PRELUDE SNAP 13 (SHEATH) IMPLANT
SHEATH PROBE COVER 6X72 (BAG) IMPLANT
SLITTER AGILIS HISPRO (INSTRUMENTS) IMPLANT
TRAY PACEMAKER INSERTION (PACKS) ×1 IMPLANT

## 2023-07-03 NOTE — Progress Notes (Signed)
This RN removed patient's arterial sheath per order. Manual pressure was held for 30 minutes, hemostasis achieved with site at level 0. Venous sheath removed per order, manual pressure held for 10 minutes, hemostasis achieved with site at level 0. Patient maintained hemodynamic stability throughout sheath removals. Gauze and tegaderm placed per protocol. Patient educated on leg immobility/bedrest and post-cath instructions.  Lilyahna Sirmon Berneta Levins, RN

## 2023-07-03 NOTE — Progress Notes (Addendum)
[facility] AND VASCULAR [facility]ENTER   MULTIDISCIPLINARY HEART VALVE TEAM  Patient Name: [name] Date of Encounter: 07/03/2023  Admit date: 07/02/2023  Primary Care Provider: Daisy Floro, MD [facility] Cardiologist: Elder Negus, MD  [facility] Electrophysiologist:  None   [facility] Problem List     Principal Problem:   S/P TAVR (transcatheter aortic valve replacement) Active Problems:   Essential hypertension   Hyperlipidemia   Severe aortic stenosis   CHB (complete heart block) (HCC)     Subjective   Getting echo with no complaints.   Inpatient Medications    Scheduled Meds:  amLODipine  10 mg Oral q AM   aspirin EC  81 mg Oral Daily   Chlorhexidine Gluconate Cloth  6 each Topical Daily   hydrochlorothiazide  25 mg Oral q morning   lisinopril  40 mg Oral q AM   rosuvastatin  20 mg Oral q AM   sodium chloride flush  3 mL Intravenous Q12H   Continuous Infusions:  sodium chloride     magnesium sulfate bolus IVPB 50 mL/hr at 07/03/23 0800   nitroGLYCERIN Stopped (07/02/23 1414)   PRN Meds: sodium chloride, acetaminophen **OR** acetaminophen, morphine injection, ondansetron (ZOFRAN) IV, oxyCODONE, sodium chloride flush, traMADol   Vital Signs    Vitals:   07/03/23 0400 07/03/23 0650 07/03/23 0700 07/03/23 0800  BP: 124/68 (!) 123/94 (!) 146/63 (!) 149/72  Pulse: 71  71 73  Resp: 17  15 14   Temp: 97.9 F (36.6 C)  98.5 F (36.9 C)   TempSrc: Oral  Oral   SpO2: 97%  96% 96%  Weight:      Height:        Intake/Output Summary (Last 24 hours) at 07/03/2023 0854 Last data filed at 07/03/2023 0800 Gross per 24 hour  Intake 2319.66 ml  Output 2200 ml  Net 119.66 ml   Filed Weights   07/01/23 1500 07/02/23 0711  Weight: 68 kg 67.6 kg    Physical Exam    GEN: Well nourished, well developed, in no acute distress.  HEENT: Grossly normal.  Neck: Supple, no JVD, carotid bruits, or masses. Cardiac: RRR, no murmurs, rubs, or gallops. No  clubbing, cyanosis, edema.   Respiratory:  Respirations regular and unlabored, clear to auscultation bilaterally. GI: Soft, nontender, nondistended, BS + x 4. MS: no deformity or atrophy. Skin: warm and dry, no rash.  Groin sites clear without hematoma or ecchymosis. Temp wire in place in left groin.  Neuro:  Strength and sensation are intact. Psych: AAOx3.  Normal affect.  Labs    CBC Recent Labs    07/03/23 0501  WBC 9.4  HGB 11.1*  HCT 34.6*  MCV 88.9  PLT 159   Basic Metabolic Panel Recent Labs    69/62/95 0708 07/03/23 0501  NA 143 140  K 3.5 3.2*  CL 108 105  CO2 23 22  GLUCOSE 137* 140*  BUN 19 12  CREATININE 1.19* 0.91  CALCIUM 10.8* 9.3  MG  --  1.3*   Liver Function Tests No results for input(s): "AST", "ALT", "ALKPHOS", "BILITOT", "PROT", "ALBUMIN" in the last 72 hours. No results for input(s): "LIPASE", "AMYLASE" in the last 72 hours. Cardiac Enzymes No results for input(s): "CKTOTAL", "CKMB", "CKMBINDEX", "TROPONINI" in the last 72 hours. BNP Invalid input(s): "POCBNP" D-Dimer No results for input(s): "DDIMER" in the last 72 hours. Hemoglobin A1C No results for input(s): "HGBA1C" in the last 72 hours. Fasting Lipid Panel No results for input(s): "  CHOL", "HDL", "LDLCALC", "TRIG", "CHOLHDL", "LDLDIRECT" in the last 72 hours. Thyroid Function Tests No results for input(s): "TSH", "T4TOTAL", "T3FREE", "THYROIDAB" in the last 72 hours.  Invalid input(s): "FREET3"  Telemetry    Sinus with some CHB and pacing at 8:25am. - Personally Reviewed  ECG    NSR with LBBB, HR 73 - Personally Reviewed  Radiology    ECHOCARDIOGRAM LIMITED  Result Date: 07/02/2023    ECHOCARDIOGRAM LIMITED REPORT   Patient Name:   Krystal GONYER Duerr Date of Exam: 07/02/2023 Medical Rec #:  [id number]     Height:       58.0 in Accession #:    [id number]    Weight:       149.0 lb Date of Birth:  Dec 26, 1934    BSA:          1.607 m Patient Age:    87 years      BP:           156/75  mmHg Patient Gender: F             HR:           74 bpm. Exam Location:  Inpatient Procedure: Limited Echo, Cardiac Doppler and Color Doppler Indications:    I35.0 Nonrheumatic aortic (valve) stenosis  History:        Patient has prior history of Echocardiogram examinations, most                 recent 04/18/2023. Aortic Valve Disease, Signs/Symptoms:Murmur                 and Edema; Risk Factors:Hypertension and Dyslipidemia. Severe                 aortic stenosis. Breast cancer.                 Aortic Valve: 26 mm CoreValve-Evolut Pro prosthetic, stented                 (TAVR) valve is present in the aortic position.  Sonographer:    Sheralyn Boatman RDCS Referring Phys: [id number] Saima Monterroso D Rembert Browe IMPRESSIONS  1. TTE guided TAVR. 26 mm Evolut Pro. Vmax 1.7 m/s, MG 4.0 mmHG, EOA 2.44 cm2, DI 0.78. Valve appears underdeployed but no paravalvular leak noted. Normal prosthetic valve function. There is a 26 mm CoreValve-Evolut Pro prosthetic (TAVR) valve present in the aortic position. Echo findings are consistent with normal structure and function of the aortic valve prosthesis.  2. Left ventricular ejection fraction, by estimation, is 70 to 75%. The left ventricle has hyperdynamic function. The left ventricle has no regional wall motion abnormalities.  3. Right ventricular systolic function is normal. The right ventricular size is normal.  4. The mitral valve is degenerative. Mild mitral valve regurgitation. No evidence of mitral stenosis. FINDINGS  Left Ventricle: Left ventricular ejection fraction, by estimation, is 70 to 75%. The left ventricle has hyperdynamic function. The left ventricle has no regional wall motion abnormalities. Right Ventricle: The right ventricular size is normal. No increase in right ventricular wall thickness. Right ventricular systolic function is normal. Left Atrium: Left atrial size was normal in size. Right Atrium: Right atrial size was normal in size. Pericardium: There is no evidence of  pericardial effusion. Mitral Valve: The mitral valve is degenerative in appearance. Mild mitral valve regurgitation. No evidence of mitral valve stenosis. Tricuspid Valve: The tricuspid valve is normal in structure. Tricuspid valve regurgitation is mild. Aortic Valve: TTE guided TAVR. 26  mm Evolut Pro. Vmax 1.7 m/s, MG 4.0 mmHG, EOA 2.44 cm2, DI 0.78. Valve appears underdeployed but no paravalvular leak noted. Normal prosthetic valve function. Aortic valve mean gradient measures 4.0 mmHg. Aortic valve peak gradient measures 11.3 mmHg. Aortic valve area, by VTI measures 2.44 cm. There is a 26 mm CoreValve-Evolut Pro prosthetic, stented (TAVR) valve present in the aortic position. Echo findings are consistent with normal structure and function of the aortic valve prosthesis. Aorta: The aortic root and ascending aorta are structurally normal, with no evidence of dilitation. IAS/Shunts: The atrial septum is grossly normal. Additional Comments: Spectral Doppler performed. Color Doppler performed.  LEFT VENTRICLE PLAX 2D LVOT diam:     2.00 cm LV SV:         61 LV SV Index:   38 LVOT Area:     3.14 cm  LV Volumes (MOD) LV vol d, MOD A2C: 80.1 ml LV vol d, MOD A4C: 49.6 ml LV vol s, MOD A2C: 19.5 ml LV vol s, MOD A4C: 12.4 ml LV SV MOD A2C:     60.6 ml LV SV MOD A4C:     49.6 ml LV SV MOD BP:      47.0 ml AORTIC VALVE AV Area (Vmax):    2.42 cm AV Area (Vmean):   3.18 cm AV Area (VTI):     2.44 cm AV Vmax:           168.00 cm/s AV Vmean:          83.800 cm/s AV VTI:            0.250 m AV Peak Grad:      11.3 mmHg AV Mean Grad:      4.0 mmHg LVOT Vmax:         129.67 cm/s LVOT Vmean:        84.919 cm/s LVOT VTI:          0.194 m LVOT/AV VTI ratio: 0.78  SHUNTS Systemic VTI:  0.19 m Systemic Diam: 2.00 cm Lennie Odor MD Electronically signed by Lennie Odor MD Signature Date/Time: 07/02/2023/12:56:34 PM    Final    Structural Heart Procedure  Result Date: 07/02/2023 See surgical note for result.   Cardiac  Studies   [facility]  TAVR OPERATIVE NOTE     Date of Procedure:                07/02/2023   Preoperative Diagnosis:      Severe Aortic Stenosis    Postoperative Diagnosis:    Same    Procedure:        Transcatheter Aortic Valve Replacement - Transfemoral Approach             Medtronic Evolut Pro THV (size 26 mm, model # [id number], serial #  [id number] )              Co-Surgeons:                        Verne Carrow, MD and Alleen Borne, MD    Anesthesiologist:                  Bradley Ferris   Echocardiographer:              O'Neal   Pre-operative Echo Findings: Severe aortic stenosis Normal left ventricular systolic function   Post-operative Echo Findings: No paravalvular leak Normal left ventricular systolic function   ___________________________  Echo 07/03/23:  pending   Patient Profile     Krystal Chambers is a 88 y.o. female with a history of HTN, HLD, thyroid disease, breast cancer s/p mastectomy, obesity (BMI 32), limited mobility and severe aortic valve stenosis who presented to [facility] on 07/02/23 for planned TAVR.   Assessment & Plan    Severe AS: s/p successful TAVR with a 26 mm Medtronic Evolut FX THV via the TF approach on 07/02/23. Post operative echo completed but pending formal read. Groin sites are stable. Started on a baby aspirin 81 mg daily.   CHB: she had an iRBBB on PAT ECG. She developed CHB after valve deployment and temp wire was left in place. She regained her intrinsic rhythm with a new LBBB but then had recurrent CHB requiring pacing at 8:25am on POD1. Will keep temp wire in place and wait for EP to see.   HTN: Bp mildly elevated. Continue Norvac 10mg  daily and Lisinopril 40mg  daily. Will add back hydrochlorothiazide 25mg  daily now. Continue to hold home Coreg 6.25mg  BID.   Hypokalemia/hypomagnesemia: this was supplemented early this morning with KDur 40mg  and mag sulfate IVPB 2mg . Will follow on labs tomorrow.    Pancreatic  lesions: pre TAVR CT showed "multiple cystic pancreatic lesions, largest is a lesion of the pancreatic body measuring 1.9 cm. Recommend follow-up with MRCP in 2 years." This will be dicussed in the outpatient setting.   Byrd Hesselbach, PA-C  07/03/2023, 8:54 AM  Pager [phone]  I have personally seen and examined this patient. I agree with the assessment and plan as outlined above.  Stable this am. No pacing overnight but episode of CHB this am requiring pacing. EP to see today to discuss permanent pacemaker placement. Replace K and Mg.   Verne Carrow, MD, [facility] 07/03/2023 9:47 AM

## 2023-07-03 NOTE — Consult Note (Signed)
ELECTROPHYSIOLOGY CONSULT NOTE    Patient ID: Krystal Chambers MRN: [id number], DOB/AGE: 1935-08-10 88 y.o.  Admit date: 07/02/2023 Date of Consult: 07/03/2023  Primary Physician: Daisy Floro, MD Primary Cardiologist: Elder Negus, MD  Electrophysiologist: New   Referring Provider: @ATTENDING @  Patient Profile: Krystal Chambers is a 88 y.o. female with a history of hypertension, hyperlipidemia, thyroid disease, breast cancer, aortic stenosis post TAVR who is being seen today for the evaluation of heart block at the request of Earney Hamburg.  HPI:  [name] is a 88 y.o. female presented to the hospital for TAVR implanted yesterday.  During the procedure, she developed complete heart block.  She had improved conduction this morning, but has subsequently had more episodes of heart block.  Her temporary wire was turned down which showed sinus rhythm and a wide-complex escape in complete heart block.  When she is being paced, she feels well and is without complaint.  She denies chest pain, palpitations, dyspnea, PND, orthopnea, nausea, vomiting, dizziness, syncope, edema, weight gain, or early satiety.   Labs Potassium3.2* (07/31 0501) Magnesium  1.3* (07/31 0501) Creatinine, ser  0.91 (07/31 0501) PLT  159 (07/31 0501) HGB  11.1* (07/31 0501) WBC 9.4 (07/31 0501)  .    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   s/p TAVR with a 26 mm Medtronic Evolut FX via the TF approach by Drs Clifton James & Bartle   Severe aortic stenosis    Thyroid disease      Surgical History:  Past Surgical History:  Procedure Laterality Date   ABDOMINAL HYSTERECTOMY     pt unsure if she's had a hysterectomy on 07/31/2017   APPENDECTOMY     BREAST BIOPSY Right 1997 X 2   CATARACT EXTRACTION W/ INTRAOCULAR LENS IMPLANT Right ~ 2015    CHOLECYSTECTOMY OPEN     FRACTURE SURGERY     INGUINAL HERNIA REPAIR Right    "not sure if it was a hernia or cyst; felt like sand; had OR; scar still there" (07/31/2017)   INTRAMEDULLARY (IM) NAIL INTERTROCHANTERIC Left 07/30/2017   INTRAMEDULLARY (IM) NAIL INTERTROCHANTERIC Left 07/30/2017   Procedure: INTRAMEDULLARY (IM) NAIL INTERTROCHANTRIC;  Surgeon: Toni Arthurs, MD;  Location: [facility] OR;  Service: Orthopedics;  Laterality: Left;   INTRAOPERATIVE TRANSTHORACIC ECHOCARDIOGRAM N/A 07/02/2023   Procedure: INTRAOPERATIVE TRANSTHORACIC ECHOCARDIOGRAM;  Surgeon: Kathleene Hazel, MD;  Location: [facility];  Service: Open Heart Surgery;  Laterality: N/A;   MASTECTOMY Right 06/18/96   RIGHT HEART CATH AND CORONARY ANGIOGRAPHY N/A 05/07/2023   Procedure: RIGHT HEART CATH AND CORONARY ANGIOGRAPHY;  Surgeon: Elder Negus, MD;  Location: [facility];  Service: Cardiovascular;  Laterality: N/A;   TONSILLECTOMY AND ADENOIDECTOMY     TRANSCATHETER AORTIC VALVE REPLACEMENT, TRANSFEMORAL N/A 07/02/2023   Procedure: Transcatheter Aortic Valve Replacement, Transfemoral;  Surgeon: Kathleene Hazel, MD;  Location: [facility];  Service: Open Heart Surgery;  Laterality: N/A;     Medications Prior to Admission  Medication Sig Dispense Refill Last Dose   acetaminophen (TYLENOL) 325 MG tablet Take 2 tablets (650 mg total) by mouth every 6 (six) hours as needed for mild pain (or Fever >/= 101).   Past Week   amLODipine (NORVASC) 10 MG tablet Take 10 mg by mouth in the  morning.   07/01/2023   carvedilol (COREG) 6.25 MG tablet Take 6.25 mg by mouth 2 (two) times daily with a meal.   07/01/2023 at 2000   Cholecalciferol (VITAMIN D3) 50 MCG (2000 UT) capsule Take 2,000 Units by mouth in the morning.   06/26/2023   CRESTOR 20 MG tablet Take 20 mg by mouth in the morning.   07/01/2023   furosemide (LASIX) 40 MG tablet Take 0.5 tablets (20 mg total) by mouth as needed. (Patient taking  differently: Take 40 mg by mouth daily.) 1 tablet 0 Past Week   hydrochlorothiazide (HYDRODIURIL) 25 MG tablet Take 25 mg by mouth every morning.   Past Week   lisinopril (ZESTRIL) 40 MG tablet Take 40 mg by mouth in the morning.   Past Week   Multiple Vitamin (MULTIVITAMIN) capsule Take 1 capsule by mouth in the morning.   06/26/2023    Inpatient Medications:   amLODipine  10 mg Oral q AM   aspirin EC  81 mg Oral Daily   Chlorhexidine Gluconate Cloth  6 each Topical Daily   hydrochlorothiazide  25 mg Oral q morning   lisinopril  40 mg Oral q AM   rosuvastatin  20 mg Oral q AM   sodium chloride flush  3 mL Intravenous Q12H    Allergies: No Known Allergies  Family History  Problem Relation Age of Onset   Heart disease Mother    Heart disease Father      Physical Exam: Vitals:   07/03/23 0800 07/03/23 0900 07/03/23 1000 07/03/23 1124  BP: (!) 149/72 135/68 (!) 148/61   Pulse: 73 77 76   Resp: 14 15 20    Temp:    98.1 F (36.7 C)  TempSrc:    Oral  SpO2: 96% 95% 96%   Weight:      Height:        GEN- NAD, A&O x 3, normal affect HEENT: Normocephalic, atraumatic Lungs- CTAB, Normal effort.  Heart- Regular rate and rhythm, No M/G/R.  GI- Soft, NT, ND.  Extremities- No clubbing, cyanosis, or edema   Radiology/Studies: ECHOCARDIOGRAM COMPLETE  Result Date: 07/03/2023    ECHOCARDIOGRAM REPORT   Patient Name:   CYLINDA NEILSEN Chambers Date of Exam: 07/03/2023 Medical Rec #:  [id number]     Height:       58.0 in Accession #:    [id number]    Weight:       149.0 lb Date of Birth:  05-26-1935    BSA:          1.607 m Patient Age:    87 years      BP:           123/94 mmHg Patient Gender: F             HR:           77 bpm. Exam Location:  Inpatient Procedure: 2D Echo, Cardiac Doppler and Color Doppler Indications:    Post TAVR evaluation V43.3/Z95.2  History:        Patient has prior history of Echocardiogram examinations, most                 recent 07/02/2023. Risk Factors:Hypertension and  Dyslipidemia.  Sonographer:    Lucendia Herrlich Referring Phys: [id number] KATHRYN R THOMPSON IMPRESSIONS  1. Left ventricular ejection fraction, by estimation, is 60 to 65%. The left ventricle has normal function. The left ventricle has no regional wall motion abnormalities. There is mild left ventricular hypertrophy.  Left ventricular diastolic parameters are consistent with Grade I diastolic dysfunction (impaired relaxation).  2. Right ventricular systolic function is normal. The right ventricular size is normal.  3. The mitral valve is normal in structure. Mild mitral valve regurgitation. No evidence of mitral stenosis.  4. The aortic valve has been repaired/replaced. Aortic valve regurgitation is not visualized. No aortic stenosis is present. Procedure Date: 07/02/23. Aortic valve area, by VTI measures 2.11 cm. Aortic valve mean gradient measures 6.2 mmHg. Aortic valve  Vmax measures 1.72 m/s.  5. The inferior vena cava is normal in size with greater than 50% respiratory variability, suggesting right atrial pressure of 3 mmHg. FINDINGS  Left Ventricle: Left ventricular ejection fraction, by estimation, is 60 to 65%. The left ventricle has normal function. The left ventricle has no regional wall motion abnormalities. The left ventricular internal cavity size was normal in size. There is  mild left ventricular hypertrophy. Left ventricular diastolic parameters are consistent with Grade I diastolic dysfunction (impaired relaxation). Right Ventricle: The right ventricular size is normal. No increase in right ventricular wall thickness. Right ventricular systolic function is normal. Left Atrium: Left atrial size was normal in size. Right Atrium: Right atrial size was normal in size. Pericardium: There is no evidence of pericardial effusion. Mitral Valve: The mitral valve is normal in structure. Mild mitral valve regurgitation. No evidence of mitral valve stenosis. Tricuspid Valve: The tricuspid valve is normal in  structure. Tricuspid valve regurgitation is not demonstrated. No evidence of tricuspid stenosis. Aortic Valve: The aortic valve has been repaired/replaced. Aortic valve regurgitation is not visualized. No aortic stenosis is present. Aortic valve mean gradient measures 6.2 mmHg. Aortic valve peak gradient measures 11.9 mmHg. Aortic valve area, by VTI  measures 2.11 cm. There is a 26 mm Medtronic CoreValve-Evolut Pro prosthetic, stented (TAVR) valve present in the aortic position. Pulmonic Valve: The pulmonic valve was normal in structure. Pulmonic valve regurgitation is not visualized. No evidence of pulmonic stenosis. Aorta: The aortic root is normal in size and structure. Venous: The inferior vena cava is normal in size with greater than 50% respiratory variability, suggesting right atrial pressure of 3 mmHg. IAS/Shunts: No atrial level shunt detected by color flow Doppler.  LEFT VENTRICLE PLAX 2D LVIDd:         3.50 cm   Diastology LVIDs:         2.05 cm   LV e' medial:    4.88 cm/s LV PW:         1.00 cm   LV E/e' medial:  12.5 LV IVS:        1.20 cm   LV e' lateral:   4.51 cm/s LVOT diam:     1.90 cm   LV E/e' lateral: 13.5 LV SV:         62 LV SV Index:   39 LVOT Area:     2.84 cm  RIGHT VENTRICLE             IVC RV S prime:     15.50 cm/s  IVC diam: 1.35 cm TAPSE (M-mode): 1.3 cm LEFT ATRIUM             Index        RIGHT ATRIUM          Index LA diam:        3.20 cm 1.99 cm/m   RA Area:     5.03 cm LA Vol (A2C):   27.5 ml 17.11 ml/m  RA Volume:  6.10 ml  3.80 ml/m LA Vol (A4C):   27.8 ml 17.27 ml/m LA Biplane Vol: 27.1 ml 16.86 ml/m  AORTIC VALVE AV Area (Vmax):    2.31 cm AV Area (Vmean):   2.20 cm AV Area (VTI):     2.11 cm AV Vmax:           172.25 cm/s AV Vmean:          113.250 cm/s AV VTI:            0.294 m AV Peak Grad:      11.9 mmHg AV Mean Grad:      6.2 mmHg LVOT Vmax:         140.40 cm/s LVOT Vmean:        87.980 cm/s LVOT VTI:          0.219 m LVOT/AV VTI ratio: 0.74  AORTA Ao Root  diam: 2.40 cm Ao Asc diam:  2.30 cm MITRAL VALVE                TRICUSPID VALVE MV Area (PHT): 3.03 cm     TR Peak grad:   8.6 mmHg MV Decel Time: 250 msec     TR Vmax:        147.00 cm/s MR Peak grad: 53.0 mmHg MR Vmax:      364.00 cm/s   SHUNTS MV E velocity: 61.10 cm/s   Systemic VTI:  0.22 m MV A velocity: 126.00 cm/s  Systemic Diam: 1.90 cm MV E/A ratio:  0.48 Donato Schultz MD Electronically signed by Donato Schultz MD Signature Date/Time: 07/03/2023/10:49:27 AM    Final    ECHOCARDIOGRAM LIMITED  Result Date: 07/02/2023    ECHOCARDIOGRAM LIMITED REPORT   Patient Name:   ELDORIS UPSHAW Borba Date of Exam: 07/02/2023 Medical Rec #:  [id number]     Height:       58.0 in Accession #:    [id number]    Weight:       149.0 lb Date of Birth:  1935/11/19    BSA:          1.607 m Patient Age:    87 years      BP:           156/75 mmHg Patient Gender: F             HR:           74 bpm. Exam Location:  Inpatient Procedure: Limited Echo, Cardiac Doppler and Color Doppler Indications:    I35.0 Nonrheumatic aortic (valve) stenosis  History:        Patient has prior history of Echocardiogram examinations, most                 recent 04/18/2023. Aortic Valve Disease, Signs/Symptoms:Murmur                 and Edema; Risk Factors:Hypertension and Dyslipidemia. Severe                 aortic stenosis. Breast cancer.                 Aortic Valve: 26 mm CoreValve-Evolut Pro prosthetic, stented                 (TAVR) valve is present in the aortic position.  Sonographer:    Sheralyn Boatman RDCS Referring Phys: [id number] CHRISTOPHER D MCALHANY IMPRESSIONS  1. TTE guided TAVR. 26 mm Evolut Pro. Vmax 1.7 m/s, MG 4.0 mmHG, EOA 2.44  cm2, DI 0.78. Valve appears underdeployed but no paravalvular leak noted. Normal prosthetic valve function. There is a 26 mm CoreValve-Evolut Pro prosthetic (TAVR) valve present in the aortic position. Echo findings are consistent with normal structure and function of the aortic valve prosthesis.  2. Left ventricular ejection  fraction, by estimation, is 70 to 75%. The left ventricle has hyperdynamic function. The left ventricle has no regional wall motion abnormalities.  3. Right ventricular systolic function is normal. The right ventricular size is normal.  4. The mitral valve is degenerative. Mild mitral valve regurgitation. No evidence of mitral stenosis. FINDINGS  Left Ventricle: Left ventricular ejection fraction, by estimation, is 70 to 75%. The left ventricle has hyperdynamic function. The left ventricle has no regional wall motion abnormalities. Right Ventricle: The right ventricular size is normal. No increase in right ventricular wall thickness. Right ventricular systolic function is normal. Left Atrium: Left atrial size was normal in size. Right Atrium: Right atrial size was normal in size. Pericardium: There is no evidence of pericardial effusion. Mitral Valve: The mitral valve is degenerative in appearance. Mild mitral valve regurgitation. No evidence of mitral valve stenosis. Tricuspid Valve: The tricuspid valve is normal in structure. Tricuspid valve regurgitation is mild. Aortic Valve: TTE guided TAVR. 26 mm Evolut Pro. Vmax 1.7 m/s, MG 4.0 mmHG, EOA 2.44 cm2, DI 0.78. Valve appears underdeployed but no paravalvular leak noted. Normal prosthetic valve function. Aortic valve mean gradient measures 4.0 mmHg. Aortic valve peak gradient measures 11.3 mmHg. Aortic valve area, by VTI measures 2.44 cm. There is a 26 mm CoreValve-Evolut Pro prosthetic, stented (TAVR) valve present in the aortic position. Echo findings are consistent with normal structure and function of the aortic valve prosthesis. Aorta: The aortic root and ascending aorta are structurally normal, with no evidence of dilitation. IAS/Shunts: The atrial septum is grossly normal. Additional Comments: Spectral Doppler performed. Color Doppler performed.  LEFT VENTRICLE PLAX 2D LVOT diam:     2.00 cm LV SV:         61 LV SV Index:   38 LVOT Area:     3.14 cm  LV  Volumes (MOD) LV vol d, MOD A2C: 80.1 ml LV vol d, MOD A4C: 49.6 ml LV vol s, MOD A2C: 19.5 ml LV vol s, MOD A4C: 12.4 ml LV SV MOD A2C:     60.6 ml LV SV MOD A4C:     49.6 ml LV SV MOD BP:      47.0 ml AORTIC VALVE AV Area (Vmax):    2.42 cm AV Area (Vmean):   3.18 cm AV Area (VTI):     2.44 cm AV Vmax:           168.00 cm/s AV Vmean:          83.800 cm/s AV VTI:            0.250 m AV Peak Grad:      11.3 mmHg AV Mean Grad:      4.0 mmHg LVOT Vmax:         129.67 cm/s LVOT Vmean:        84.919 cm/s LVOT VTI:          0.194 m LVOT/AV VTI ratio: 0.78  SHUNTS Systemic VTI:  0.19 m Systemic Diam: 2.00 cm Lennie Odor MD Electronically signed by Lennie Odor MD Signature Date/Time: 07/02/2023/12:56:34 PM    Final    Structural Heart Procedure  Result Date: 07/02/2023 See surgical note for result.  DG  Chest 2 View  Result Date: 06/30/2023 CLINICAL DATA:  Preop exam.  Severe aortic stenosis. EXAM: CHEST - 2 VIEW COMPARISON:  Chest CT 05/13/2023 FINDINGS: The heart is normal in size. Aortic tortuosity. Normal pulmonary vasculature. No pleural effusion, pneumothorax, or focal airspace disease. Right axillary surgical clips. Bilateral shoulder arthropathy. IMPRESSION: No acute chest findings. Electronically Signed   By: Narda Rutherford M.D.   On: 06/30/2023 16:55    EKG: Review of prior EKGs shows NSR with LAFB +/- iRBBB 100-152ms. Normal PR interval.   Updated EKGs reveal LBBB s/p TAVR.  (personally reviewed)  TELEMETRY: Currently V pacing in 60s with underlying CHB when turned down earlier (personally reviewed)  Assessment/Plan:  CHB s/p TAVR No clear reversible causes, and with baseline conduction disease and continued CHB, unlikely to have full recovery.  ~Bifascicular block at baseline, New LBBB s/p TAVR Explained risks, benefits, and alternatives to PPM implantation, including but not limited to bleeding, infection, pneumothorax, pericardial effusion, lead dislodgement, heart attack, stroke, or  death.  Pt verbalized understanding and agrees to proceed.    Aortic stenosis: Status post TAVR.  Plan per primary team.  For questions or updates, please contact [facility] Please consult [email or web address] for contact info under Cardiology/STEMI.  Signed, Callin Ashe Jorja Loa, MD  07/03/2023 12:06 PM

## 2023-07-03 NOTE — Progress Notes (Signed)
Echocardiogram 2D Echocardiogram has been performed.  Krystal Chambers 07/03/2023, 9:12 AM

## 2023-07-04 ENCOUNTER — Inpatient Hospital Stay (HOSPITAL_COMMUNITY): Payer: Medicare Other

## 2023-07-04 ENCOUNTER — Encounter (HOSPITAL_COMMUNITY): Payer: Self-pay | Admitting: Cardiology

## 2023-07-04 ENCOUNTER — Other Ambulatory Visit (HOSPITAL_COMMUNITY): Payer: Self-pay

## 2023-07-04 DIAGNOSIS — E669 Obesity, unspecified: Secondary | ICD-10-CM | POA: Diagnosis not present

## 2023-07-04 DIAGNOSIS — I350 Nonrheumatic aortic (valve) stenosis: Secondary | ICD-10-CM | POA: Diagnosis not present

## 2023-07-04 DIAGNOSIS — Z006 Encounter for examination for normal comparison and control in clinical research program: Secondary | ICD-10-CM | POA: Diagnosis not present

## 2023-07-04 DIAGNOSIS — I442 Atrioventricular block, complete: Secondary | ICD-10-CM | POA: Diagnosis not present

## 2023-07-04 LAB — MAGNESIUM: Magnesium: 3.1 mg/dL — ABNORMAL HIGH (ref 1.7–2.4)

## 2023-07-04 MED ORDER — ASPIRIN 81 MG PO TBEC: 81.0000 mg | DELAYED_RELEASE_TABLET | Freq: Every day | ORAL | 12 refills | Status: AC

## 2023-07-04 NOTE — Plan of Care (Signed)
  Problem: Education: Goal: Knowledge of General Education information will improve Description: Including pain rating scale, medication(s)/side effects and non-pharmacologic comfort measures 07/04/2023 1455 by Grasiela Jonsson, [place] L, RN Outcome: Adequate for Discharge 07/04/2023 0727 by Dallin Mccorkel, [place] L, RN Outcome: Progressing   Problem: Health Behavior/Discharge Planning: Goal: Ability to manage health-related needs will improve Outcome: Adequate for Discharge   Problem: Clinical Measurements: Goal: Ability to maintain clinical measurements within normal limits will improve 07/04/2023 1455 by Keerthi Hazell, [place] L, RN Outcome: Adequate for Discharge 07/04/2023 0727 by Lennon Boutwell, [place] L, RN Outcome: Progressing Goal: Will remain free from infection Outcome: Adequate for Discharge Goal: Diagnostic test results will improve Outcome: Adequate for Discharge Goal: Respiratory complications will improve Outcome: Adequate for Discharge Goal: Cardiovascular complication will be avoided Outcome: Adequate for Discharge   Problem: Activity: Goal: Risk for activity intolerance will decrease Outcome: Adequate for Discharge   Problem: Nutrition: Goal: Adequate nutrition will be maintained 07/04/2023 1455 by Dilpreet Faires, [place] L, RN Outcome: Adequate for Discharge 07/04/2023 0727 by Laruth Hanger, [place] L, RN Outcome: Progressing   Problem: Coping: Goal: Level of anxiety will decrease 07/04/2023 1455 by Mayar Whittier, [place] L, RN Outcome: Adequate for Discharge 07/04/2023 0727 by Tanesha Arambula, [place] L, RN Outcome: Progressing   Problem: Elimination: Goal: Will not experience complications related to bowel motility Outcome: Adequate for Discharge Goal: Will not experience complications related to urinary retention Outcome: Adequate for Discharge   Problem: Pain Managment: Goal: General experience of comfort will improve 07/04/2023 1455 by Laren Whaling, [place] L, RN Outcome: Adequate for Discharge 07/04/2023 0727 by  Aprel Egelhoff, [place] L, RN Outcome: Progressing   Problem: Safety: Goal: Ability to remain free from injury will improve Outcome: Adequate for Discharge   Problem: Skin Integrity: Goal: Risk for impaired skin integrity will decrease Outcome: Adequate for Discharge   Problem: Education: Goal: Knowledge of cardiac device and self-care will improve Outcome: Adequate for Discharge Goal: Ability to safely manage health related needs after discharge will improve Outcome: Adequate for Discharge Goal: Individualized Educational Video(s) Outcome: Adequate for Discharge   Problem: Cardiac: Goal: Ability to achieve and maintain adequate cardiopulmonary perfusion will improve 07/04/2023 1455 by Starlena Beil, [place] L, RN Outcome: Adequate for Discharge 07/04/2023 0727 by Giulian Goldring, [place] L, RN Outcome: Progressing

## 2023-07-04 NOTE — Progress Notes (Signed)
PIVs removed. Pacemaker box/instructions and AVS given, all questions answered. Follow up appointments discussed. Daughter to pick up within the hour, will escort to entrance when ride is here.

## 2023-07-04 NOTE — Plan of Care (Signed)
  Problem: Education: Goal: Knowledge of General Education information will improve Description: Including pain rating scale, medication(s)/side effects and non-pharmacologic comfort measures Outcome: Progressing   Problem: Clinical Measurements: Goal: Ability to maintain clinical measurements within normal limits will improve Outcome: Progressing   Problem: Nutrition: Goal: Adequate nutrition will be maintained Outcome: Progressing   Problem: Coping: Goal: Level of anxiety will decrease Outcome: Progressing   Problem: Pain Managment: Goal: General experience of comfort will improve Outcome: Progressing   Problem: Cardiac: Goal: Ability to achieve and maintain adequate cardiopulmonary perfusion will improve Outcome: Progressing

## 2023-07-04 NOTE — Progress Notes (Addendum)
  Patient Name: Krystal Chambers Date of Encounter: 07/04/2023  Primary Cardiologist: Elder Negus, MD Electrophysiologist: Dr. Elberta Fortis  Interval Summary   NAEO. Conduction has improved somewhat.  Inpatient Medications    Scheduled Meds:  amLODipine  10 mg Oral q AM   aspirin EC  81 mg Oral Daily   Chlorhexidine Gluconate Cloth  6 each Topical Daily   hydrochlorothiazide  25 mg Oral q morning   lisinopril  40 mg Oral q AM   rosuvastatin  20 mg Oral q AM   sodium chloride flush  3 mL Intravenous Q12H   Continuous Infusions:  sodium chloride     nitroGLYCERIN Stopped (07/02/23 1414)   PRN Meds: sodium chloride, acetaminophen **OR** acetaminophen, morphine injection, ondansetron (ZOFRAN) IV, mouth rinse, oxyCODONE, sodium chloride flush, traMADol   Vital Signs    Vitals:   07/04/23 0500 07/04/23 0600 07/04/23 0700 07/04/23 0834  BP: 102/68 113/64 (!) 107/54   Pulse: 74 79 88   Resp: 15 18 (!) 8   Temp:    97.9 F (36.6 C)  TempSrc:    Oral  SpO2: 93% 97% 99%   Weight:  68.4 kg    Height:        Intake/Output Summary (Last 24 hours) at 07/04/2023 0846 Last data filed at 07/04/2023 0615 Gross per 24 hour  Intake 593.13 ml  Output 1050 ml  Net -456.87 ml   Filed Weights   07/01/23 1500 07/02/23 0711 07/04/23 0600  Weight: 68 kg 67.6 kg 68.4 kg    Physical Exam    GEN- The patient is well appearing, alert and oriented x 3 today.   Lungs- Clear to ausculation bilaterally, normal work of breathing Cardiac- Regular rate and rhythm, no murmurs, rubs or gallops GI- soft, NT, ND, + BS Extremities- no clubbing or cyanosis. No edema  Telemetry    NSR 80-90s (personally reviewed)  Hospital Course    AMELYAH BARGERSTOCK is a 88 y.o. female with a history of hypertension, hyperlipidemia, thyroid disease, breast cancer, aortic stenosis post TAVR who is being seen today for the evaluation of heart block at the request of Earney Hamburg.   Assessment & Plan    CHB s/p  TAVR No clear reversible causes, and with baseline conduction disease and continued CHB, unlikely to have full recovery.  She has had some conduction over night, but suspect ultimately she Tasmin Exantus continue to pace.  CXR stable.  Interrogation stable; P waves somewhat smaller but other parameters stable.  Usual follow up in place Wound care and arm restrictions reviewed and placed in AVS.   EP Khamille Beynon see as needed while remains here. OK for home from our perspective.   For questions or updates, please contact [facility] Please consult [email or web address] for contact info under Cardiology/STEMI.  Signed, Graciella Freer, PA-C  07/04/2023, 8:46 AM   I have seen and examined this patient with Otilio Saber.  Agree with above, note added to reflect my findings.  On exam, regular rhythm, no murmurs, lungs clear.  She is now status post Abbott dual-chamber pacemaker for complete heart block post TAVR.  Device functioning appropriately.  Chest x-ray and interrogation without issue.  Would be okay for discharge today with follow-up in device clinic.  Riely Oetken M. Beatryce Colombo MD 07/04/2023 9:25 AM

## 2023-07-04 NOTE — Plan of Care (Signed)
  Problem: Education: Goal: Knowledge of General Education information will improve Description: Including pain rating scale, medication(s)/side effects and non-pharmacologic comfort measures Outcome: Progressing   Problem: Pain Managment: Goal: General experience of comfort will improve Outcome: Progressing   Problem: Cardiac: Goal: Ability to achieve and maintain adequate cardiopulmonary perfusion will improve Outcome: Progressing

## 2023-07-04 NOTE — Evaluation (Signed)
Physical Therapy Evaluation Patient Details Name: Krystal Chambers MRN: [id number] DOB: [birth year] Today's Date: 07/04/2023  History of Present Illness  88 y.o. female who presented to [facility] on 07/02/23 for planned TAVR. History of HTN, HLD, thyroid disease, breast cancer s/p mastectomy, obesity (BMI 32), limited mobility and severe aortic valve stenosis  Clinical Impression   Pt admitted secondary to problem above with deficits below. PTA patient was living alone and walking without a device. She does own a RW, cane, and BSC from when she broke her hip. Pt currently requires minguard assist for transfers and ambulation x 30 ft with a RW due to left knee pain. Patient reporting no steps to enter daughter's home and can park close to the door. Patient able to go home with use of her RW (and possibly BSC for nighttime) and family's assist/supervision. If pt remains hospitalized, anticipate patient will benefit from PT to address problems listed below. Will continue to follow acutely to maximize functional mobility independence and safety.           If plan is discharge home, recommend the following: A little help with walking and/or transfers;A little help with bathing/dressing/bathroom;Assistance with cooking/housework;Direct supervision/assist for medications management;Direct supervision/assist for financial management;Assist for transportation   Can travel by private vehicle        Equipment Recommendations None recommended by PT  Recommendations for Other Services       Functional Status Assessment Patient has had a recent decline in their functional status and demonstrates the ability to make significant improvements in function in a reasonable and predictable amount of time.     Precautions / Restrictions Precautions Precautions: Fall Restrictions Weight Bearing Restrictions: No      Mobility  Bed Mobility               General bed mobility comments: up in recliner     Transfers Overall transfer level: Needs assistance Equipment used: Rolling walker (2 wheels) Transfers: Sit to/from Stand Sit to Stand: Min guard, From elevated surface           General transfer comment: pillow in seat of chair; using armrests with cues for technique initial time, repeated without cues or physical assist    Ambulation/Gait Ambulation/Gait assistance: Min guard Gait Distance (Feet): 30 Feet Assistive device: Rolling walker (2 wheels) Gait Pattern/deviations: Step-through pattern, Decreased stride length, Decreased weight shift to left   Gait velocity interpretation: <1.8 ft/sec, indicate of risk for recurrent falls   General Gait Details: pt moving very slowly but without a limp; maneuvered RW through multiple turns without assist; closing her eyes at times and required cues to keep eyes open  Stairs            Wheelchair Mobility     Tilt Bed    Modified Rankin (Stroke Patients Only)       Balance Overall balance assessment: Needs assistance Sitting-balance support: No upper extremity supported, Feet supported Sitting balance-Leahy Scale: Good     Standing balance support: Bilateral upper extremity supported, Reliant on assistive device for balance Standing balance-Leahy Scale: Poor                               Pertinent Vitals/Pain Pain Assessment Pain Assessment: 0-10 Pain Score: 8  Pain Location: left knee Pain Descriptors / Indicators: Grimacing, Guarding Pain Intervention(s): Limited activity within patient's tolerance, Monitored during session, RN gave pain meds during session  Home Living Family/patient expects to be discharged to:: Private residence Living Arrangements: Children (going to stay with her daughter) Available Help at Discharge: Family;Available PRN/intermittently (~3 hours in afternoon when she will be alone) Type of Home: House Home Access: Level entry       Home Layout: Multi-level;Able to  live on main level with bedroom/bathroom Home Equipment: Rolling Walker (2 wheels);Cane - single point;BSC/3in1      Prior Function Prior Level of Function : Independent/Modified Independent;Driving             Mobility Comments: reports she lives alone and normally walks without a device. Does her own housework ADLs Comments: daughter brings her groceries     Hand Dominance   Dominant Hand: Right    Extremity/Trunk Assessment   Upper Extremity Assessment Upper Extremity Assessment: Generalized weakness    Lower Extremity Assessment Lower Extremity Assessment: Generalized weakness (no edema Lt knee noted, no incr warmth)    Cervical / Trunk Assessment Cervical / Trunk Assessment: Other exceptions Cervical / Trunk Exceptions: overweight  Communication   Communication: No difficulties  Cognition Arousal/Alertness: Lethargic, Suspect due to medications (closing eyes when walking; cues to keep them open) Behavior During Therapy: Flat affect Overall Cognitive Status: No family/caregiver present to determine baseline cognitive functioning                                 General Comments: has told Cardiac Rehab that she walked in the room this morning and told PT she has not walked but only stood up        General Comments      Exercises     Assessment/Plan    PT Assessment Patient needs continued PT services  PT Problem List Decreased strength;Decreased activity tolerance;Decreased balance;Decreased mobility;Obesity;Pain       PT Treatment Interventions DME instruction;Gait training;Functional mobility training;Therapeutic activities;Therapeutic exercise;Patient/family education    PT Goals (Current goals can be found in the Care Plan section)  Acute Rehab PT Goals Patient Stated Goal: go home today PT Goal Formulation: With patient Time For Goal Achievement: 07/18/23 Potential to Achieve Goals: Good    Frequency Min 1X/week      Co-evaluation               AM-PAC PT "6 Clicks" Mobility  Outcome Measure Help needed turning from your back to your side while in a flat bed without using bedrails?: A Little Help needed moving from lying on your back to sitting on the side of a flat bed without using bedrails?: A Little Help needed moving to and from a bed to a chair (including a wheelchair)?: A Little Help needed standing up from a chair using your arms (e.g., wheelchair or bedside chair)?: A Little Help needed to walk in hospital room?: A Little Help needed climbing 3-5 steps with a railing? : Total 6 Click Score: 16    End of Session Equipment Utilized During Treatment: Gait belt Activity Tolerance: Patient limited by fatigue;Patient limited by lethargy Patient left: in chair;with call bell/phone within reach Nurse Communication: Mobility status PT Visit Diagnosis: Other abnormalities of gait and mobility (R26.89);Pain Pain - Right/Left: Left Pain - part of body: Knee    Time: 6295-2841 PT Time Calculation (min) (ACUTE ONLY): 26 min   Charges:   PT Evaluation $PT Eval Low Complexity: 1 Low PT Treatments $Gait Training: 8-22 mins PT General Charges $$ ACUTE PT VISIT: 1 Visit  Jerolyn Center, PT Acute Rehabilitation Services  Office [phone]   Zena Amos 07/04/2023, 2:55 PM

## 2023-07-04 NOTE — TOC Initial Note (Signed)
Transition of Care [facility]) - Initial/Assessment Note    Patient Details  Name: Krystal Chambers MRN: [id number] Date of Birth: 04-08-1935  [facility]) CM/SW Contact:    Elliot Cousin, RN Phone Number: [phone] 07/04/2023, 10:03 AM  Clinical Narrative:     CM spoke to pt and states she lives at home alone. She was independent PTA. Her dtr Bjorn Loser assist her with her care and transportation to appts. Gave permission to speak to dtr.   8/1 1000 CM contacted pt's dtr, states she will provide transportation home. Pt has RW and cane at home but does not need to use for ambulation. She takes her to appts. She would like to schedule the PCP follow up appt due to her schedule .               Expected Discharge Plan: Home/Self Care Barriers to Discharge: No Barriers Identified   Patient Goals and CMS Choice Patient states their goals for this hospitalization and ongoing recovery are:: wants to remain independent          Expected Discharge Plan and Services   Discharge Planning Services: CM Consult   Living arrangements for the past 2 months: Single Family Home Expected Discharge Date: 07/04/23                                    Prior Living Arrangements/Services Living arrangements for the past 2 months: Single Family Home Lives with:: Self Patient language and need for interpreter reviewed:: Yes        Need for Family Participation in Patient Care: No (Comment) Care giver support system in place?: Yes (comment) Current home services: DME (rolling walker, cane) Criminal Activity/Legal Involvement Pertinent to Current Situation/Hospitalization: No - Comment as needed  Activities of Daily Living Home Assistive Devices/Equipment: Dentures (specify type), Eyeglasses, Cane (specify quad or straight), Grab bars around toilet, Grab bars in shower, Blood pressure cuff, Scales ADL Screening (condition at time of admission) Patient's cognitive ability adequate  to safely complete daily activities?: Yes Is the patient deaf or have difficulty hearing?: No Does the patient have difficulty seeing, even when wearing glasses/contacts?: No Does the patient have difficulty concentrating, remembering, or making decisions?: No Patient able to express need for assistance with ADLs?: Yes Does the patient have difficulty dressing or bathing?: No Independently performs ADLs?: Yes (appropriate for developmental age) Does the patient have difficulty walking or climbing stairs?: Yes Weakness of Legs: Left Weakness of Arms/Hands: None  Permission Sought/Granted Permission sought to share information with : Case Manager, PCP, Family Supports Permission granted to share information with : Yes, Verbal Permission Granted  Share Information with NAME: Laban Emperor     Permission granted to share info w Relationship: daughter  Permission granted to share info w Contact Information: [phone]  Emotional Assessment Appearance:: Appears stated age Attitude/Demeanor/Rapport: Lethargic Affect (typically observed): Accepting Orientation: : Oriented to Self, Oriented to Place, Oriented to  Time, Oriented to Situation   Psych Involvement: No (comment)  Admission diagnosis:  S/P TAVR (transcatheter aortic valve replacement) [Z95.2] Patient Active Problem List   Diagnosis Date Noted   CHB (complete heart block) (HCC) 07/03/2023   S/P TAVR (transcatheter aortic valve replacement) 07/02/2023   Severe aortic stenosis 04/26/2023   Leg edema 04/02/2023   Essential hypertension 07/30/2017   Closed fracture of femur, intertrochanteric, left, initial encounter (HCC) 07/30/2017  History of thyroid disease 07/30/2017   Closed intertrochanteric fracture of hip, left, initial encounter (HCC) 07/30/2017   Normocytic anemia 07/30/2017   Hyperlipidemia 07/30/2017   Thrombocytopenia (HCC) 07/30/2017   History of breast cancer in female 04/01/2012   PCP:  Daisy Floro,  MD Pharmacy:   [id number] - [place], [place] - [phone] AT [facility] [address] [place] [address] Phone: [phone] Fax: [phone]     Social Determinants of Health (SDOH) Social History: SDOH Screenings   Food Insecurity: No Food Insecurity (07/04/2023)  Housing: Low Risk  (07/04/2023)  Transportation Needs: No Transportation Needs (07/04/2023)  Utilities: Not At Risk (07/04/2023)  Tobacco Use: Low Risk  (07/02/2023)   SDOH Interventions: Food Insecurity Interventions: Intervention Not Indicated Housing Interventions: Intervention Not Indicated Transportation Interventions: Intervention Not Indicated Utilities Interventions: Intervention Not Indicated   Readmission Risk Interventions     No data to display

## 2023-07-04 NOTE — Progress Notes (Signed)
CARDIAC REHAB PHASE I   Pt sitting in chair, feeling well this morning. Pt reports feeling little tired from busy morning. Pt reports getting oob in room and ambulating this morning prior to going to radiology department. Tolerated well with no dizziness or SOB. Pt is eager to get home for some rest. Post TAVR education including site care, restrictions, risk factors, exercise guidelines, heart healthy diet and CRP2 reviewed. All questions and concerns addressed. Will refer to [facility] for CRP2. Plan for home later today.   [id number]  Woodroe Chen, RN BSN 07/04/2023 9:01 AM

## 2023-07-04 NOTE — Progress Notes (Signed)
CARDIAC REHAB PHASE I   Asked by [place] RN to come reassess ambulation after pt was very weak getting to [facility]. Assisted pt to standing from [facility], pt very weak only able to take small step enough to get back to chair. PT evaluation placed per K. Janee Morn PA.   Woodroe Chen, RN BSN 07/04/2023 11:16 AM

## 2023-07-05 ENCOUNTER — Telehealth: Payer: Self-pay

## 2023-07-05 NOTE — Telephone Encounter (Signed)
Patient contacted regarding discharge from [facility] on 07/04/2023.  Patient understands to follow up with provider Carlean Jews PA-C on 07/17/2023 at 2:45 PM at [facility] office. (Pt was initially scheduled for TOC visit on 8/9 but Rhonda requested that this be rescheduled to an afternoon due to her work schedule.) Patient understands discharge instructions? yes Patient understands medications and regiment? yes Patient understands to bring all medications to this visit? Yes  Per Bjorn Loser the patient was weak after her procedure and having difficulty walking due to arthritis.  She has noticed that the pt's mobility is better at home.

## 2023-07-09 ENCOUNTER — Telehealth (HOSPITAL_COMMUNITY): Payer: Self-pay

## 2023-07-09 NOTE — Telephone Encounter (Signed)
Called patient to see if she is interested in the Cardiac Rehab Program. Patient's daughter expressed interest. Explained scheduling process and went over insurance process, patient's daughter verbalized understanding. Will contact patient's daughter for scheduling once f/u has been completed.

## 2023-07-12 ENCOUNTER — Ambulatory Visit: Payer: Medicare Other

## 2023-07-17 ENCOUNTER — Ambulatory Visit: Payer: Medicare Other | Attending: Internal Medicine

## 2023-07-17 ENCOUNTER — Ambulatory Visit: Payer: Medicare Other | Admitting: Physician Assistant

## 2023-07-17 VITALS — BP 104/50 | HR 65 | Ht <= 58 in | Wt 146.4 lb

## 2023-07-17 DIAGNOSIS — I442 Atrioventricular block, complete: Secondary | ICD-10-CM

## 2023-07-17 DIAGNOSIS — K869 Disease of pancreas, unspecified: Secondary | ICD-10-CM | POA: Diagnosis not present

## 2023-07-17 DIAGNOSIS — Z950 Presence of cardiac pacemaker: Secondary | ICD-10-CM

## 2023-07-17 DIAGNOSIS — I10 Essential (primary) hypertension: Secondary | ICD-10-CM

## 2023-07-17 DIAGNOSIS — Z952 Presence of prosthetic heart valve: Secondary | ICD-10-CM

## 2023-07-17 LAB — CUP PACEART INCLINIC DEVICE CHECK
Battery Remaining Longevity: 141 mo
Battery Voltage: 3.1 V
Brady Statistic RA Percent Paced: 31 %
Brady Statistic RV Percent Paced: 0.1 %
Date Time Interrogation Session: 20240814164704
Implantable Lead Connection Status: 753985
Implantable Lead Connection Status: 753985
Implantable Lead Implant Date: 20240731
Implantable Lead Implant Date: 20240731
Implantable Lead Location: 753859
Implantable Lead Location: 753860
Implantable Pulse Generator Implant Date: 20240731
Lead Channel Impedance Value: 437.5 Ohm
Lead Channel Impedance Value: 475 Ohm
Lead Channel Pacing Threshold Amplitude: 0.5 V
Lead Channel Pacing Threshold Amplitude: 0.875 V
Lead Channel Pacing Threshold Pulse Width: 0.5 ms
Lead Channel Pacing Threshold Pulse Width: 0.5 ms
Lead Channel Sensing Intrinsic Amplitude: 10.9 mV
Lead Channel Sensing Intrinsic Amplitude: 2.2 mV
Lead Channel Setting Pacing Amplitude: 1.125
Lead Channel Setting Pacing Amplitude: 1.5 V
Lead Channel Setting Pacing Pulse Width: 0.5 ms
Lead Channel Setting Sensing Sensitivity: 2 mV
Pulse Gen Model: 2272
Pulse Gen Serial Number: 5819091

## 2023-07-17 NOTE — Patient Instructions (Signed)
Medication Instructions:  Your physician recommends that you continue on your current medications as directed. Please refer to the Current Medication list given to you today.  *If you need a refill on your cardiac medications before your next appointment, please call your pharmacy*   Lab Work: None ordered   If you have labs (blood work) drawn today and your tests are completely normal, you will receive your results only by: [place] (if you have MyChart) OR A paper copy in the mail If you have any lab test that is abnormal or we need to change your treatment, we will call you to review the results.   Testing/Procedures: None ordered    Follow-Up: Follow up as scheduled   Other Instructions

## 2023-07-17 NOTE — Patient Instructions (Signed)

## 2023-07-17 NOTE — Progress Notes (Addendum)
[facility]   MULTIDISCIPLINARY HEART VALVE CLINIC                                     Cardiology Office Note:    Date:  07/17/2023   ID:  Krystal Chambers, DOB 01-10-1935, MRN [id number]  PCP:  Daisy Floro, MD  [facility] Cardiologist:  Elder Negus, MD  / Dr. Clifton James & Dr. Laneta Simmers (TAVR)  [facility] Electrophysiologist:  None   Referring MD: Daisy Floro, MD   [facility] s/p TAVR  History of Present Illness:    Krystal Chambers is a 88 y.o. female with a hx of HTN, HLD, thyroid disease, breast cancer s/p mastectomy, obesity (BMI 32), limited mobility and severe aortic valve stenosis s/p TAVR (07/02/23) c/b CHB s/p PPM (07/03/23) who presents to clinic for follow up.  She was referred to Dr. Rosemary Holms for evaluation of a heart murmur and reported shortness of breath on exertion.  A 2D echocardiogram on 04/18/2023 showed EF 60%, severe LVH, and severe AS with a mean grad 51 mmHg, AVA 0.9 cm2, mild AI/MR. She underwent cardiac catheterization on 05/07/2023 showing no obstructive coronary disease with normal filling pressures.   She was evaluated by the multidisciplinary valve team and he underwent successful TAVR with a 26 mm Medtronic Evolut FX THV via the TF approach on 07/02/23. Post operative echo showed EF 60%, normally functioning TAVR with a mean gradient of 6.2 mmHg and no PVL. She developed intermittent CHB and underwent placement of an Abbott Assurity [id number] dual-chamber pacemaker by Dr. Elberta Fortis on 07/03/23. She was started on a baby aspirin 81 mg daily.   Today the patient presents to clinic for follow up. Here with daughter. No CP or SOB. Has mild LLE edema from a hip injury. No orthopnea or PND. No dizziness or syncope. No blood in stool or urine. No palpitations.     Past Medical History:  Diagnosis Date   Arthritis    "left knee" (07/31/2017)   Cancer of right breast (HCC) 1997   Hypertension    S/P TAVR (transcatheter aortic valve replacement)  07/02/2023   s/p TAVR with a 26 mm Medtronic Evolut FX via the TF approach by Drs Clifton James & Bartle   Severe aortic stenosis    Thyroid disease     Past Surgical History:  Procedure Laterality Date   ABDOMINAL HYSTERECTOMY     pt unsure if she's had a hysterectomy on 07/31/2017   APPENDECTOMY     BREAST BIOPSY Right 1997 X 2   CATARACT EXTRACTION W/ INTRAOCULAR LENS IMPLANT Right ~ 2015   CHOLECYSTECTOMY OPEN     FRACTURE SURGERY     INGUINAL HERNIA REPAIR Right    "not sure if it was a hernia or cyst; felt like sand; had OR; scar still there" (07/31/2017)   INTRAMEDULLARY (IM) NAIL INTERTROCHANTERIC Left 07/30/2017   INTRAMEDULLARY (IM) NAIL INTERTROCHANTERIC Left 07/30/2017   Procedure: INTRAMEDULLARY (IM) NAIL INTERTROCHANTRIC;  Surgeon: Toni Arthurs, MD;  Location: [facility] OR;  Service: Orthopedics;  Laterality: Left;   INTRAOPERATIVE TRANSTHORACIC ECHOCARDIOGRAM N/A 07/02/2023   Procedure: INTRAOPERATIVE TRANSTHORACIC ECHOCARDIOGRAM;  Surgeon: Kathleene Hazel, MD;  Location: [facility] INVASIVE CV LAB;  Service: Open Heart Surgery;  Laterality: N/A;   MASTECTOMY Right 06/18/96   PACEMAKER IMPLANT N/A 07/03/2023   Procedure: PACEMAKER IMPLANT;  Surgeon: Regan Lemming, MD;  Location: [facility] INVASIVE  CV LAB;  Service: Cardiovascular;  Laterality: N/A;   RIGHT HEART CATH AND CORONARY ANGIOGRAPHY N/A 05/07/2023   Procedure: RIGHT HEART CATH AND CORONARY ANGIOGRAPHY;  Surgeon: Elder Negus, MD;  Location: [facility] INVASIVE CV LAB;  Service: Cardiovascular;  Laterality: N/A;   TONSILLECTOMY AND ADENOIDECTOMY     TRANSCATHETER AORTIC VALVE REPLACEMENT, TRANSFEMORAL N/A 07/02/2023   Procedure: Transcatheter Aortic Valve Replacement, Transfemoral;  Surgeon: Kathleene Hazel, MD;  Location: [facility] INVASIVE CV LAB;  Service: Open Heart Surgery;  Laterality: N/A;    Current Medications: Current Meds  Medication Sig   acetaminophen (TYLENOL) 325 MG tablet Take 2 tablets (650 mg total) by mouth  every 6 (six) hours as needed for mild pain (or Fever >/= 101).   amLODipine (NORVASC) 10 MG tablet Take 10 mg by mouth in the morning.   aspirin EC 81 MG tablet Take 1 tablet (81 mg total) by mouth daily. Swallow whole.   carvedilol (COREG) 6.25 MG tablet Take 6.25 mg by mouth 2 (two) times daily with a meal.   Cholecalciferol (VITAMIN D3) 50 MCG (2000 UT) capsule Take 2,000 Units by mouth in the morning.   CRESTOR 20 MG tablet Take 20 mg by mouth in the morning.   furosemide (LASIX) 40 MG tablet Take 0.5 tablets (20 mg total) by mouth as needed.   hydrochlorothiazide (HYDRODIURIL) 25 MG tablet Take 25 mg by mouth every morning.   lisinopril (ZESTRIL) 40 MG tablet Take 40 mg by mouth in the morning.   Multiple Vitamin (MULTIVITAMIN) capsule Take 1 capsule by mouth in the morning.     Allergies:   Patient has no known allergies.   Social History   Socioeconomic History   Marital status: Widowed    Spouse name: Not on file   Number of children: 3   Years of education: Not on file   Highest education level: Not on file  Occupational History   Occupation: Retired-nursing Geophysicist/field seismologist  Tobacco Use   Smoking status: Never   Smokeless tobacco: Never  Vaping Use   Vaping status: Never Used  Substance and Sexual Activity   Alcohol use: No   Drug use: No   Sexual activity: Never  Other Topics Concern   Not on file  Social History Narrative   Not on file   Social Determinants of Health   Financial Resource Strain: Not on file  Food Insecurity: No Food Insecurity (07/04/2023)   Hunger Vital Sign    Worried About Running Out of Food in the Last Year: Never true    Ran Out of Food in the Last Year: Never true  Transportation Needs: No Transportation Needs (07/04/2023)   PRAPARE - Administrator, Civil Service (Medical): No    Lack of Transportation (Non-Medical): No  Physical Activity: Not on file  Stress: Not on file  Social Connections: Not on file     Family  History: The patient's family history includes Heart disease in her father and mother.  ROS:   Please see the history of present illness.    All other systems reviewed and are negative.  EKGs/Labs/Other Studies Reviewed:    Cardiac Studies & Procedures   CARDIAC CATHETERIZATION  CARDIAC CATHETERIZATION 05/07/2023  Narrative Images from the original result were not included. LM: Normal LAD: Aneurysmal prox-mid LAD without significant stenosis Lcx; Normal RCA: Aneurysmal prox-mid RCA without significant stenosis  RA: 2 mmHg RV: 28/6 mmHg PA: 27/13 mmHg, mPAP 19 mmHg PCW: 12 mmHg  CO: 6  L/min CI: 3.7 L/min/m2  No obstructive coronary artery disease Normal filling pressures  Continue TAVR workup   Elder Negus, MD Pager: [phone] Office: [phone]  Findings Coronary Findings Diagnostic  Dominance: Right  Left Main Vessel is normal in caliber. Vessel is angiographically normal.  Left Anterior Descending Aneurysmal prox-mid LAD without significant stenosis  Left Circumflex Vessel is normal in caliber. Vessel is angiographically normal.  Right Coronary Artery Aneurysmal prox-mid RCA without significant stenosis  Intervention  No interventions have been documented.     ECHOCARDIOGRAM  ECHOCARDIOGRAM COMPLETE 07/03/2023  Narrative ECHOCARDIOGRAM REPORT    Patient Name:   Krystal Chambers Ellenberger Date of Exam: 07/03/2023 Medical Rec #:  [id number]     Height:       58.0 in Accession #:    [id number]    Weight:       149.0 lb Date of Birth:  Jul 07, 1935    BSA:          1.607 m Patient Age:    87 years      BP:           123/94 mmHg Patient Gender: F             HR:           77 bpm. Exam Location:  Inpatient  Procedure: 2D Echo, Cardiac Doppler and Color Doppler  Indications:    Post TAVR evaluation V43.3/Z95.2  History:        Patient has prior history of Echocardiogram examinations, most recent 07/02/2023. Risk Factors:Hypertension and  Dyslipidemia.  Sonographer:    Lucendia Herrlich Referring Phys: [id number] Savannaha Stonerock R Kavya Haag  IMPRESSIONS   1. Left ventricular ejection fraction, by estimation, is 60 to 65%. The left ventricle has normal function. The left ventricle has no regional wall motion abnormalities. There is mild left ventricular hypertrophy. Left ventricular diastolic parameters are consistent with Grade I diastolic dysfunction (impaired relaxation). 2. Right ventricular systolic function is normal. The right ventricular size is normal. 3. The mitral valve is normal in structure. Mild mitral valve regurgitation. No evidence of mitral stenosis. 4. The aortic valve has been repaired/replaced. Aortic valve regurgitation is not visualized. No aortic stenosis is present. Procedure Date: 07/02/23. Aortic valve area, by VTI measures 2.11 cm. Aortic valve mean gradient measures 6.2 mmHg. Aortic valve Vmax measures 1.72 m/s. 5. The inferior vena cava is normal in size with greater than 50% respiratory variability, suggesting right atrial pressure of 3 mmHg.  FINDINGS Left Ventricle: Left ventricular ejection fraction, by estimation, is 60 to 65%. The left ventricle has normal function. The left ventricle has no regional wall motion abnormalities. The left ventricular internal cavity size was normal in size. There is mild left ventricular hypertrophy. Left ventricular diastolic parameters are consistent with Grade I diastolic dysfunction (impaired relaxation).  Right Ventricle: The right ventricular size is normal. No increase in right ventricular wall thickness. Right ventricular systolic function is normal.  Left Atrium: Left atrial size was normal in size.  Right Atrium: Right atrial size was normal in size.  Pericardium: There is no evidence of pericardial effusion.  Mitral Valve: The mitral valve is normal in structure. Mild mitral valve regurgitation. No evidence of mitral valve stenosis.  Tricuspid Valve: The  tricuspid valve is normal in structure. Tricuspid valve regurgitation is not demonstrated. No evidence of tricuspid stenosis.  Aortic Valve: The aortic valve has been repaired/replaced. Aortic valve regurgitation is not visualized. No aortic stenosis is present. Aortic valve mean gradient  measures 6.2 mmHg. Aortic valve peak gradient measures 11.9 mmHg. Aortic valve area, by VTI measures 2.11 cm. There is a 26 mm Medtronic CoreValve-Evolut Pro prosthetic, stented (TAVR) valve present in the aortic position.  Pulmonic Valve: The pulmonic valve was normal in structure. Pulmonic valve regurgitation is not visualized. No evidence of pulmonic stenosis.  Aorta: The aortic root is normal in size and structure.  Venous: The inferior vena cava is normal in size with greater than 50% respiratory variability, suggesting right atrial pressure of 3 mmHg.  IAS/Shunts: No atrial level shunt detected by color flow Doppler.   LEFT VENTRICLE PLAX 2D LVIDd:         3.50 cm   Diastology LVIDs:         2.05 cm   LV e' medial:    4.88 cm/s LV PW:         1.00 cm   LV E/e' medial:  12.5 LV IVS:        1.20 cm   LV e' lateral:   4.51 cm/s LVOT diam:     1.90 cm   LV E/e' lateral: 13.5 LV SV:         62 LV SV Index:   39 LVOT Area:     2.84 cm   RIGHT VENTRICLE             IVC RV S prime:     15.50 cm/s  IVC diam: 1.35 cm TAPSE (M-mode): 1.3 cm  LEFT ATRIUM             Index        RIGHT ATRIUM          Index LA diam:        3.20 cm 1.99 cm/m   RA Area:     5.03 cm LA Vol (A2C):   27.5 ml 17.11 ml/m  RA Volume:   6.10 ml  3.80 ml/m LA Vol (A4C):   27.8 ml 17.27 ml/m LA Biplane Vol: 27.1 ml 16.86 ml/m AORTIC VALVE AV Area (Vmax):    2.31 cm AV Area (Vmean):   2.20 cm AV Area (VTI):     2.11 cm AV Vmax:           172.25 cm/s AV Vmean:          113.250 cm/s AV VTI:            0.294 m AV Peak Grad:      11.9 mmHg AV Mean Grad:      6.2 mmHg LVOT Vmax:         140.40 cm/s LVOT Vmean:         87.980 cm/s LVOT VTI:          0.219 m LVOT/AV VTI ratio: 0.74  AORTA Ao Root diam: 2.40 cm Ao Asc diam:  2.30 cm  MITRAL VALVE                TRICUSPID VALVE MV Area (PHT): 3.03 cm     TR Peak grad:   8.6 mmHg MV Decel Time: 250 msec     TR Vmax:        147.00 cm/s MR Peak grad: 53.0 mmHg MR Vmax:      364.00 cm/s   SHUNTS MV E velocity: 61.10 cm/s   Systemic VTI:  0.22 m MV A velocity: 126.00 cm/s  Systemic Diam: 1.90 cm MV E/A ratio:  0.48  Donato Schultz MD Electronically signed by Donato Schultz MD  Signature Date/Time: 07/03/2023/10:49:27 AM    Final     CT SCANS  CT CORONARY MORPH W/CTA COR W/SCORE 05/13/2023  Addendum 05/13/2023  6:34 PM ADDENDUM REPORT: 05/13/2023 18:32  CLINICAL DATA:  Aortic Valve pathology with assessment for TAVR  EXAM: Cardiac TAVR CT  TECHNIQUE: The patient was scanned on a Siemens Force 192 slice scanner. A 120 kV retrospective scan was triggered in the descending thoracic aorta at 111 HU's. Gantry rotation speed was 270 msecs and collimation was .9 mm. No beta blockade or nitro were given. The 3D data set was reconstructed in 5% intervals of the R-R cycle. Systolic and diastolic phases were analyzed on a dedicated work station using MPR, MIP and VRT modes. The patient received 95 cc of contrast.  FINDINGS: Aortic Valve: Severely thickened tri-leaflet aortic valve with heavy calcification and reduced excursion the planimeter valve area is 0.925 Sq cm consistent with severe aortic stenosis  LVOT calcification: none  Annular calcification: none  Aortic Valve Calcium Score: 2058  Presence of basal septal hypertrophy: Yes  - Systolic annular measurements > diastolic measurements; see below  Perimembranous septal diameter: 5 mm  Mitral Valve: No calcifications  Aortic Annulus Measurements- 20%  Major annulus diameter: 23 mm  Minor annulus diameter:18 mm  Annular perimeter: 66 mm  Annular area: 3.30 cm2  Aortic Root  Measurements  Sinotubular Junction: 25 mm  Ascending Thoracic Aorta: 31 mm  Aortic Arch: 24 mm  Descending Thoracic Aorta: 27 mm  Aortic atherosclerosis.  Sinus of Valsalva Measurements:  Right coronary cusp width: 29 mm  Left coronary cusp width: 28 mm  Non coronary cusp width: 28 mm  Coronary Artery Height above Annulus:  Left Main: 11 mm  Left SoV height: 16 mm  Right Coronary: 16 mm  Right SoV height: 17 mm  Optimum Fluoroscopic Angle for Delivery: LAO 4, CAU 3  Cusp overlay view angle: RAO 9, CAU 9  Valves for structural team consideration: Sinus dimensions supportive of a 26 mm Evolut Valve  At the overlap between a 20 mm and 23 mm Sapien Valve  Non TAVR Valve Findings:  Coronary Arteries: Normal coronary origin. Study not completed with nitroglycerin.  Coronary Calcium Score:  Left main: 51  Left anterior descending artery: 21  Left circumflex artery: 14  Right coronary artery: 4  Total: 90  Systemic veins: Normal anatomy  Main Pulmonary artery: Normal caliber  Pulmonary veins: Normal anatomy  Left atrial appendage: Patent  Interatrial septum: No clear communications.  Left ventricle: Severe septal hypertrophy (18 mm). No systolic anterior motion of the anterior mitral valve leaflet.  Left atrium: Normal size  Right ventricle: Normal size  Right atrium: Normal size  Pericardium: No calcifications  Extra Cardiac Findings as per separate reporting.  IMPRESSION: 1. Severe Aortic stenosis. Findings pertinent to TAVR procedure are detailed above.  RECOMMENDATIONS:  The proposed cut-off value of 1,651 AU yielded a 93 % sensitivity and 75 % specificity in grading AS severity in patients with classical low-flow, low-gradient AS. Proposed different cut-off values to define severe AS for men and women as 2,065 AU and 1,274 AU, respectively. The joint European and American recommendations for the assessment of AS consider the  aortic valve calcium score as a continuum - a very high calcium score suggests severe AS and a low calcium score suggests severe AS is unlikely.  Sunday Shams, et al. 2017 ESC/EACTS Guidelines for the management of valvular heart disease. Eur Heart J [phone]  Coronary artery calcium (CAC) score is a strong predictor of incident coronary heart disease (CHD) and provides predictive information beyond traditional risk factors. CAC scoring is reasonable to use in the decision to withhold, postpone, or initiate statin therapy in intermediate-risk or selected borderline-risk asymptomatic adults (age 74-75 years and LDL-C >=70 to <190 mg/dL) who do not have diabetes or established atherosclerotic cardiovascular disease (ASCVD).* In intermediate-risk (10-year ASCVD risk >=7.5% to <20%) adults or selected borderline-risk (10-year ASCVD risk >=5% to <7.5%) adults in whom a CAC score is measured for the purpose of making a treatment decision the following recommendations have been made:  If CAC = 0, it is reasonable to withhold statin therapy and reassess in 5 to 10 years, as long as higher risk conditions are absent (diabetes mellitus, family history of premature CHD in first degree relatives (males <55 years; females <65 years), cigarette smoking, LDL >=190 mg/dL or other independent risk factors).  If CAC is 1 to 99, it is reasonable to initiate statin therapy for patients >=38 years of age.  If CAC is >=100 or >=75th percentile, it is reasonable to initiate statin therapy at any age.  Cardiology referral should be considered for patients with CAC scores >=400 or >=75th percentile.  *2018 AHA/ACC/AACVPR/AAPA/ABC/ACPM/ADA/AGS/APhA/ASPC/NLA/PCNA Guideline on the Management of Blood Cholesterol: A Report of the American College of Cardiology/American Heart Association Task Force on Clinical Practice Guidelines. J Am Coll Cardiol. 2019;73(24):3168-3209.  Mahesh   Chandrasekhar   Electronically Signed By: Riley Lam M.D. On: 05/13/2023 18:32  Narrative EXAM: OVER-READ INTERPRETATION  CT CHEST  The following report is a limited chest CT over-read performed by radiologist Dr. Allegra Lai of [facility] on 05/13/2023. This over-read does not include interpretation of cardiac or coronary anatomy or pathology. The cardiac TAVR interpretation by the cardiologist is attached.  COMPARISON:  None Available.  FINDINGS: Extracardiac findings will be described separately under dictation for contemporaneously obtained CTA chest, abdomen and pelvis.  IMPRESSION: Please see separate dictation for contemporaneously obtained CTA chest, abdomen and pelvis dated 05/13/2023 for full description of relevant extracardiac findings.  Electronically Signed: By: Allegra Lai M.D. On: 05/13/2023 14:19          EKG:  EKG is ordered today.  The ekg ordered today demonstrates sinus with LBBB HR 65  Recent Labs: 04/09/2023: NT-Pro BNP 89 06/28/2023: ALT 12 07/04/2023: BUN 11; Creatinine, Ser 1.02; Hemoglobin 11.7; Magnesium 3.1; Platelets 94; Potassium 4.0; Sodium 131  Recent Lipid Panel No results found for: "CHOL", "TRIG", "HDL", "CHOLHDL", "VLDL", "LDLCALC", "LDLDIRECT"   Risk Assessment/Calculations:            Physical Exam:    VS:  BP (!) 104/50   Pulse 65   Ht 4\' 10"  (1.473 m)   Wt 146 lb 6.4 oz (66.4 kg)   SpO2 95%   BMI 30.60 kg/m     Wt Readings from Last 3 Encounters:  07/17/23 146 lb 6.4 oz (66.4 kg)  07/04/23 150 lb 12.7 oz (68.4 kg)  06/12/23 150 lb (68 kg)     GEN:  Well nourished, well developed in no acute distress HEENT: Normal NECK: No JVD LYMPHATICS: No lymphadenopathy CARDIAC: RRR, no murmurs, rubs, gallops RESPIRATORY:  Clear to auscultation without rales, wheezing or rhonchi  ABDOMEN: Soft, non-tender, non-distended MUSCULOSKELETAL:  mild Left LE edema; No deformity  SKIN: Warm and  dry.  Groin sites clear without hematoma or ecchymosis  NEUROLOGIC:  Alert and oriented x 3 PSYCHIATRIC:  Normal  affect   ASSESSMENT:    1. S/P TAVR (transcatheter aortic valve replacement)   2. Pacemaker   3. Essential hypertension   4. Lesion of pancreas    PLAN:    In order of problems listed above:  Severe AS s/p TAVR: doing well 1 week out from TAVR. Groin sites are stable. ECG with no HAVB. Continue baby aspirin 81 mg daily. SBE prophylaxis discussed; the patient is edentulous and does not go to the dentist. I will see her back in 1 month for echo and follow up.    CHB s/p PPM: followed by by Dr. Elberta Fortis.   HTN: BP on soft side but asymptomatic. Continue Norvac 10mg  daily, Lisinopril 40mg  daily, hydrochlorothiazide 25mg  and Coreg 6.25mg  BID.   Pancreatic lesions: pre TAVR CT showed "multiple cystic pancreatic lesions, largest is a lesion of the pancreatic body measuring 1.9 cm. Recommend follow-up with MRCP in 2 years." This was printed out of her and will deferred to her PCP.      Cardiac Rehabilitation Eligibility Assessment  The patient is ready to start cardiac rehabilitation from a cardiac standpoint.     Medication Adjustments/Labs and Tests Ordered: Current medicines are reviewed at length with the patient today.  Concerns regarding medicines are outlined above.  Orders Placed This Encounter  Procedures   EKG 12-Lead   No orders of the defined types were placed in this encounter.   Patient Instructions  Medication Instructions:  Your physician recommends that you continue on your current medications as directed. Please refer to the Current Medication list given to you today.  *If you need a refill on your cardiac medications before your next appointment, please call your pharmacy*   Lab Work: None ordered   If you have labs (blood work) drawn today and your tests are completely normal, you will receive your results only by: MyChart Message (if you have  MyChart) OR A paper copy in the mail If you have any lab test that is abnormal or we need to change your treatment, we will call you to review the results.   Testing/Procedures: None ordered    Follow-Up: Follow up as scheduled   Other Instructions     Signed, Cline Crock, PA-C  07/17/2023 5:57 PM    [facility]

## 2023-07-17 NOTE — Progress Notes (Signed)

## 2023-07-28 ENCOUNTER — Other Ambulatory Visit: Payer: Self-pay | Admitting: Cardiology

## 2023-08-08 NOTE — Progress Notes (Unsigned)
[facility]   MULTIDISCIPLINARY HEART VALVE CLINIC                                     Cardiology Office Note:    Date:  08/12/2023   ID:  Krystal Chambers, DOB 10/12/1935, MRN [id number]  PCP:  Daisy Floro, MD  [facility] Cardiologist:  Elder Negus, MD  / Dr. Clifton James & Dr. Laneta Simmers (TAVR)  [facility] Electrophysiologist:  None   Referring MD: Daisy Floro, MD   1 month s/p TAVR  History of Present Illness:    Krystal Chambers is a 88 y.o. female with a hx of HTN, HLD, thyroid disease, breast cancer s/p mastectomy, obesity (BMI 32), limited mobility and severe aortic valve stenosis s/p TAVR (07/02/23) c/b CHB s/p PPM (07/03/23) who presents to clinic for follow up.  She was referred to Dr. Rosemary Holms for evaluation of a heart murmur and reported shortness of breath on exertion. A 2D echocardiogram on 04/18/2023 showed EF 60%, severe LVH, and severe AS with a mean grad 51 mmHg, AVA 0.9 cm2, mild AI/MR. She underwent cardiac catheterization on 05/07/2023 showing no obstructive coronary disease with normal filling pressures. S/p TAVR with a 26 mm Medtronic Evolut FX THV via the TF approach on 07/02/23. Post operative echo showed EF 60%, normally functioning TAVR with a mean gradient of 6.2 mmHg and no PVL. She developed intermittent CHB and underwent placement of an Abbott Assurity [id number] dual-chamber pacemaker by Dr. Elberta Fortis on 07/03/23. She was started on a baby aspirin 81 mg daily.   Today the patient presents to clinic for follow up. Here with daughter. No CP or SOB. Has mild LLE edema from a hip injury. No orthopnea or PND. No dizziness or syncope. No blood in stool or urine. No palpitations. Bp had been running soft and PCP stopped hydrochlorothiazide.   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   s/p TAVR  with a 26 mm Medtronic Evolut FX via the TF approach by Drs Clifton James & Bartle   Severe aortic stenosis    Thyroid disease     Past Surgical History:  Procedure Laterality Date   ABDOMINAL HYSTERECTOMY     pt unsure if she's had a hysterectomy on 07/31/2017   APPENDECTOMY     BREAST BIOPSY Right 1997 X 2   CATARACT EXTRACTION W/ INTRAOCULAR LENS IMPLANT Right ~ 2015   CHOLECYSTECTOMY OPEN     FRACTURE SURGERY     INGUINAL HERNIA REPAIR Right    "not sure if it was a hernia or cyst; felt like sand; had OR; scar still there" (07/31/2017)   INTRAMEDULLARY (IM) NAIL INTERTROCHANTERIC Left 07/30/2017   INTRAMEDULLARY (IM) NAIL INTERTROCHANTERIC Left 07/30/2017   Procedure: INTRAMEDULLARY (IM) NAIL INTERTROCHANTRIC;  Surgeon: Toni Arthurs, MD;  Location: [facility] OR;  Service: Orthopedics;  Laterality: Left;   INTRAOPERATIVE TRANSTHORACIC ECHOCARDIOGRAM N/A 07/02/2023   Procedure: INTRAOPERATIVE TRANSTHORACIC ECHOCARDIOGRAM;  Surgeon: Kathleene Hazel, MD;  Location: [facility] INVASIVE CV LAB;  Service: Open Heart Surgery;  Laterality: N/A;   MASTECTOMY Right 06/18/96   PACEMAKER IMPLANT N/A 07/03/2023   Procedure: PACEMAKER IMPLANT;  Surgeon: Regan Lemming, MD;  Location: [facility] INVASIVE CV LAB;  Service: Cardiovascular;  Laterality: N/A;   RIGHT HEART CATH AND CORONARY ANGIOGRAPHY N/A 05/07/2023   Procedure: RIGHT HEART CATH AND CORONARY ANGIOGRAPHY;  Surgeon: Elder Negus, MD;  Location: [facility] INVASIVE CV LAB;  Service: Cardiovascular;  Laterality: N/A;   TONSILLECTOMY AND ADENOIDECTOMY     TRANSCATHETER AORTIC VALVE REPLACEMENT, TRANSFEMORAL N/A 07/02/2023   Procedure: Transcatheter Aortic Valve Replacement, Transfemoral;  Surgeon: Kathleene Hazel, MD;  Location: [facility] INVASIVE CV LAB;  Service: Open Heart Surgery;  Laterality: N/A;    Current Medications: Current Meds  Medication Sig   acetaminophen (TYLENOL) 325 MG tablet Take 2 tablets (650 mg total) by mouth every 6 (six) hours as  needed for mild pain (or Fever >/= 101).   amLODipine (NORVASC) 10 MG tablet Take 10 mg by mouth in the morning.   aspirin EC 81 MG tablet Take 1 tablet (81 mg total) by mouth daily. Swallow whole.   carvedilol (COREG) 6.25 MG tablet Take 6.25 mg by mouth 2 (two) times daily with a meal.   Cholecalciferol (VITAMIN D3) 50 MCG (2000 UT) capsule Take 2,000 Units by mouth in the morning.   CRESTOR 20 MG tablet Take 20 mg by mouth in the morning.   furosemide (LASIX) 40 MG tablet TAKE 1 TABLET BY MOUTH EVERY DAY (Patient taking differently: Take 20 mg by mouth daily as needed for fluid or edema.)   hydrochlorothiazide (HYDRODIURIL) 25 MG tablet Take 25 mg by mouth every morning.   lisinopril (ZESTRIL) 40 MG tablet Take 40 mg by mouth in the morning.   Multiple Vitamin (MULTIVITAMIN) capsule Take 1 capsule by mouth in the morning.     Allergies:   Patient has no known allergies.   Social History   Socioeconomic History   Marital status: Widowed    Spouse name: Not on file   Number of children: 3   Years of education: Not on file   Highest education level: Not on file  Occupational History   Occupation: Retired-nursing Geophysicist/field seismologist  Tobacco Use   Smoking status: Never   Smokeless tobacco: Never  Vaping Use   Vaping status: Never Used  Substance and Sexual Activity   Alcohol use: No   Drug use: No   Sexual activity: Never  Other Topics Concern   Not on file  Social History Narrative   Not on file   Social Determinants of Health   Financial Resource Strain: Not on file  Food Insecurity: No Food Insecurity (07/04/2023)   Hunger Vital Sign    Worried About Running Out of Food in the Last Year: Never true    Ran Out of Food in the Last Year: Never true  Transportation Needs: No Transportation Needs (07/04/2023)   PRAPARE - Administrator, Civil Service (Medical): No    Lack of Transportation (Non-Medical): No  Physical Activity: Not on file  Stress: Not on file  Social  Connections: Not on file     Family History: The patient's family history includes Heart disease in her father and mother.  ROS:   Please see the history of present illness.    All other systems reviewed and are negative.  EKGs/Labs/Other Studies Reviewed:      EKG:  EKG is NOT ordered today.    Recent Labs: 04/09/2023: NT-Pro BNP 89 06/28/2023: ALT 12 07/04/2023: BUN 11; Creatinine, Ser 1.02; Hemoglobin 11.7; Magnesium 3.1; Platelets 94; Potassium 4.0; Sodium 131  Recent Lipid Panel No results found for: "CHOL", "TRIG", "HDL", "CHOLHDL", "VLDL", "LDLCALC", "LDLDIRECT"  Risk Assessment/Calculations:            Physical Exam:    VS:  BP 132/80   Pulse 65   Ht 4\' 10"  (1.473 m)   Wt 146 lb 12.8 oz (66.6 kg)   SpO2 99%   BMI 30.68 kg/m     Wt Readings from Last 3 Encounters:  08/09/23 146 lb 12.8 oz (66.6 kg)  07/17/23 146 lb 6.4 oz (66.4 kg)  07/04/23 150 lb 12.7 oz (68.4 kg)     GEN:  Well nourished, well developed in no acute distress HEENT: Normal NECK: No JVD LYMPHATICS: No lymphadenopathy CARDIAC: RRR, no murmurs, rubs, gallops RESPIRATORY:  Clear to auscultation without rales, wheezing or rhonchi  ABDOMEN: Soft, non-tender, non-distended MUSCULOSKELETAL:  mild Left LE edema; No deformity  SKIN: Warm and dry. NEUROLOGIC:  Alert and oriented x 3 PSYCHIATRIC:  Normal affect   ASSESSMENT:    1. S/P TAVR (transcatheter aortic valve replacement)   2. Pacemaker   3. Essential hypertension   4. Lesion of pancreas     PLAN:    In order of problems listed above:  Severe AS s/p TAVR: echo today shows EF 60%, normally functioning TAVR with a mean gradient of 12 mm hg and no PVL. She has NYHA class I symptoms. Continue baby aspirin 81 mg daily. SBE prophylaxis discussed; the patient is edentulous and does not go to the dentist. I will see her back in 1 year for echo and follow up.    CHB s/p PPM: followed by by Dr. Elberta Fortis.   HTN: BP well controlled.  Continue Norvac 10mg  daily, Lisinopril 40mg  daily, and Coreg 6.25mg  BID.   Pancreatic lesions: pre TAVR CT showed "multiple cystic pancreatic lesions, largest is a lesion of the pancreatic body measuring 1.9 cm. Recommend follow-up with MRCP in 2 years." This was printed out of her and will deferred to her PCP.    Medication Adjustments/Labs and Tests Ordered: Current medicines are reviewed at length with the patient today.  Concerns regarding medicines are outlined above.  No orders of the defined types were placed in this encounter.  No orders of the defined types were placed in this encounter.   Patient Instructions  Medication Instructions:   Your physician recommends that you continue on your current medications as directed. Please refer to the Current Medication list given to you today.   *If you need a refill on your cardiac medications before your next appointment, please call your pharmacy*   Lab Work:  None ordered.  If you have labs (blood work) drawn today and your tests are completely normal, you will receive your results only by: MyChart Message (if you have MyChart) OR A paper copy in the mail If you have any lab test that is abnormal or we need to change your treatment, we will call you to review the results.   Testing/Procedures:   None ordered.   Follow-Up: At [facility], you and your health needs are our priority.  As part of our continuing mission to provide you with exceptional heart care, we have created designated Provider Care Teams.  These Care Teams include your primary Cardiologist (physician) and Advanced Practice Providers (APPs -  Physician Assistants and Nurse Practitioners) who all work together to provide you with the care you need, when you need it.  We recommend signing up for the patient portal called "MyChart".  Sign up information is provided on this After Visit Summary.  MyChart is used  to connect with patients for Virtual  Visits (Telemedicine).  Patients are able to view lab/test results, encounter notes, upcoming appointments, etc.  Non-urgent messages can be sent to your provider as well.   To learn more about what you can do with MyChart, go to [email or web address].    Your next appointment:   6 month(s)  Provider:   Carlean Jews, PA-C       Signed, Cline Crock, PA-C  08/12/2023 8:54 AM    [facility]

## 2023-08-09 ENCOUNTER — Ambulatory Visit (INDEPENDENT_AMBULATORY_CARE_PROVIDER_SITE_OTHER): Payer: Medicare Other | Admitting: Physician Assistant

## 2023-08-09 ENCOUNTER — Ambulatory Visit: Payer: Medicare Other | Attending: Cardiology

## 2023-08-09 VITALS — BP 132/80 | HR 65 | Ht <= 58 in | Wt 146.8 lb

## 2023-08-09 DIAGNOSIS — K869 Disease of pancreas, unspecified: Secondary | ICD-10-CM

## 2023-08-09 DIAGNOSIS — I10 Essential (primary) hypertension: Secondary | ICD-10-CM | POA: Insufficient documentation

## 2023-08-09 DIAGNOSIS — Z950 Presence of cardiac pacemaker: Secondary | ICD-10-CM | POA: Diagnosis present

## 2023-08-09 DIAGNOSIS — Z952 Presence of prosthetic heart valve: Secondary | ICD-10-CM

## 2023-08-09 LAB — ECHOCARDIOGRAM COMPLETE
AR max vel: 0.87 cm2
AV Area VTI: 1.01 cm2
AV Area mean vel: 0.94 cm2
AV Mean grad: 12 mmHg
AV Peak grad: 24.3 mmHg
Ao pk vel: 2.47 m/s
Area-P 1/2: 2.08 cm2
S' Lateral: 2.7 cm

## 2023-08-09 NOTE — Patient Instructions (Signed)
Medication Instructions:   Your physician recommends that you continue on your current medications as directed. Please refer to the Current Medication list given to you today.   *If you need a refill on your cardiac medications before your next appointment, please call your pharmacy*   Lab Work:  None ordered.  If you have labs (blood work) drawn today and your tests are completely normal, you will receive your results only by: MyChart Message (if you have MyChart) OR A paper copy in the mail If you have any lab test that is abnormal or we need to change your treatment, we will call you to review the results.   Testing/Procedures:   None ordered.   Follow-Up: At [facility], you and your health needs are our priority.  As part of our continuing mission to provide you with exceptional heart care, we have created designated Provider Care Teams.  These Care Teams include your primary Cardiologist (physician) and Advanced Practice Providers (APPs -  Physician Assistants and Nurse Practitioners) who all work together to provide you with the care you need, when you need it.  We recommend signing up for the patient portal called "MyChart".  Sign up information is provided on this After Visit Summary.  MyChart is used to connect with patients for Virtual Visits (Telemedicine).  Patients are able to view lab/test results, encounter notes, upcoming appointments, etc.  Non-urgent messages can be sent to your provider as well.   To learn more about what you can do with MyChart, go to [email or web address].    Your next appointment:   6 month(s)  Provider:   Carlean Jews, PA-C

## 2023-09-18 ENCOUNTER — Ambulatory Visit: Payer: Medicare Other | Admitting: Cardiology

## 2023-10-03 ENCOUNTER — Ambulatory Visit (INDEPENDENT_AMBULATORY_CARE_PROVIDER_SITE_OTHER): Payer: Medicare Other

## 2023-10-03 DIAGNOSIS — I442 Atrioventricular block, complete: Secondary | ICD-10-CM | POA: Diagnosis not present

## 2023-10-03 LAB — CUP PACEART REMOTE DEVICE CHECK
Battery Remaining Longevity: 133 mo
Battery Remaining Percentage: 95.5 %
Battery Voltage: 3.04 V
Brady Statistic AP VP Percent: 1 %
Brady Statistic AP VS Percent: 42 %
Brady Statistic AS VP Percent: 1 %
Brady Statistic AS VS Percent: 58 %
Brady Statistic RA Percent Paced: 41 %
Brady Statistic RV Percent Paced: 1 %
Date Time Interrogation Session: 20241031020016
Implantable Lead Connection Status: 753985
Implantable Lead Connection Status: 753985
Implantable Lead Implant Date: 20240731
Implantable Lead Implant Date: 20240731
Implantable Lead Location: 753859
Implantable Lead Location: 753860
Implantable Pulse Generator Implant Date: 20240731
Lead Channel Impedance Value: 530 Ohm
Lead Channel Impedance Value: 540 Ohm
Lead Channel Pacing Threshold Amplitude: 0.625 V
Lead Channel Pacing Threshold Amplitude: 0.75 V
Lead Channel Pacing Threshold Pulse Width: 0.5 ms
Lead Channel Pacing Threshold Pulse Width: 0.5 ms
Lead Channel Sensing Intrinsic Amplitude: 11.8 mV
Lead Channel Sensing Intrinsic Amplitude: 2.6 mV
Lead Channel Setting Pacing Amplitude: 1 V
Lead Channel Setting Pacing Amplitude: 1.625
Lead Channel Setting Pacing Pulse Width: 0.5 ms
Lead Channel Setting Sensing Sensitivity: 2 mV
Pulse Gen Model: 2272
Pulse Gen Serial Number: 5819091

## 2023-10-11 ENCOUNTER — Encounter: Payer: Medicare Other | Admitting: Cardiology

## 2023-10-16 NOTE — Progress Notes (Signed)
Remote pacemaker transmission.   

## 2023-10-17 NOTE — Progress Notes (Signed)
  Electrophysiology Office Note:   Date:  10/18/2023  ID:  Krystal Chambers, DOB 1935-04-28, MRN [id number]  Primary Cardiologist: Elder Negus, MD Electrophysiologist: Regan Lemming, MD       History of Present Illness:   Krystal Chambers is a 88 y.o. female with h/o CHB s/p PPM, AV stenosis s/p TAVR seen today for routine electrophysiology followup.   Since last being seen in our clinic the patient reports she has been doing well. No concerns in regards to device.  Notes slight keloid scarring but she always has this after surgery.    She denies chest pain, palpitations, dyspnea, PND, orthopnea, nausea, vomiting, dizziness, syncope, edema, weight gain, or early satiety.   Review of systems complete and found to be negative unless listed in HPI.    EP Information / Studies Reviewed:    EKG is not ordered today. EKG from 07/17/23 reviewed which showed NSR 65bpm, LAD, LBBB      PPM Interrogation-  reviewed in detail today,  See PACEART report.  Device History: Abbott Dual Chamber PPM implanted 07/03/2023 for intermittent CHB Not dependent on check 10/18/23  Studies:  [facility] 05/2023 > non-obs CAD, normal filling pressures  ECHO 08/2023 > LVEF 60-65%, no RWMA, mild LVH, G1DD, AV repaired/replaced, no stenosis present      Physical Exam:   VS:  BP 138/84 (BP Location: Left Arm, Patient Position: Sitting, Cuff Size: Normal)   Pulse 70   Ht 4\' 10"  (1.473 m)   Wt 146 lb (66.2 kg)   SpO2 100%   BMI 30.51 kg/m    Wt Readings from Last 3 Encounters:  10/18/23 146 lb (66.2 kg)  08/09/23 146 lb 12.8 oz (66.6 kg)  07/17/23 146 lb 6.4 oz (66.4 kg)     GEN: Well nourished, well developed in no acute distress NECK: No JVD; No carotid bruits CARDIAC: Regular rate and rhythm, no murmurs, rubs, gallops RESPIRATORY:  Clear to auscultation without rales, wheezing or rhonchi  ABDOMEN: Soft, non-tender, non-distended EXTREMITIES:  No edema; No deformity   ASSESSMENT AND PLAN:     Intermittent CHB s/p Abbott PPM  -Normal PPM function -See Pace Art report -No changes today  VHD s/p TAVR HTN  -follows with Dr. Rosemary Holms   Disposition:   Follow up with Dr. Elberta Fortis in 6 months  Signed, Canary Brim, MSN, APRN, NP-C, AGACNP-BC [facility] - Electrophysiology  10/18/2023, 1:32 PM

## 2023-10-18 ENCOUNTER — Encounter: Payer: Self-pay | Admitting: Pulmonary Disease

## 2023-10-18 ENCOUNTER — Ambulatory Visit: Payer: Medicare Other | Attending: Cardiology | Admitting: Pulmonary Disease

## 2023-10-18 VITALS — BP 138/84 | HR 70 | Ht <= 58 in | Wt 146.0 lb

## 2023-10-18 DIAGNOSIS — I442 Atrioventricular block, complete: Secondary | ICD-10-CM | POA: Diagnosis not present

## 2023-10-18 DIAGNOSIS — Z950 Presence of cardiac pacemaker: Secondary | ICD-10-CM | POA: Diagnosis not present

## 2023-10-18 LAB — CUP PACEART INCLINIC DEVICE CHECK
Battery Remaining Longevity: 138 mo
Battery Voltage: 3.04 V
Brady Statistic RA Percent Paced: 40 %
Brady Statistic RV Percent Paced: 0.12 %
Date Time Interrogation Session: 20241115121922
Implantable Lead Connection Status: 753985
Implantable Lead Connection Status: 753985
Implantable Lead Implant Date: 20240731
Implantable Lead Implant Date: 20240731
Implantable Lead Location: 753859
Implantable Lead Location: 753860
Implantable Pulse Generator Implant Date: 20240731
Lead Channel Impedance Value: 475 Ohm
Lead Channel Impedance Value: 550 Ohm
Lead Channel Pacing Threshold Amplitude: 0.625 V
Lead Channel Pacing Threshold Amplitude: 0.875 V
Lead Channel Pacing Threshold Pulse Width: 0.5 ms
Lead Channel Pacing Threshold Pulse Width: 0.5 ms
Lead Channel Sensing Intrinsic Amplitude: 11.5 mV
Lead Channel Sensing Intrinsic Amplitude: 2.2 mV
Lead Channel Setting Pacing Amplitude: 1.125
Lead Channel Setting Pacing Amplitude: 1.625
Lead Channel Setting Pacing Pulse Width: 0.5 ms
Lead Channel Setting Sensing Sensitivity: 2 mV
Pulse Gen Model: 2272
Pulse Gen Serial Number: 5819091

## 2023-10-18 NOTE — Patient Instructions (Signed)
Medication Instructions:  Your physician recommends that you continue on your current medications as directed. Please refer to the Current Medication list given to you today.  *If you need a refill on your cardiac medications before your next appointment, please call your pharmacy*  Lab Work: None ordered If you have labs (blood work) drawn today and your tests are completely normal, you will receive your results only by: MyChart Message (if you have MyChart) OR A paper copy in the mail If you have any lab test that is abnormal or we need to change your treatment, we will call you to review the results.  Follow-Up: At [facility], you and your health needs are our priority.  As part of our continuing mission to provide you with exceptional heart care, we have created designated Provider Care Teams.  These Care Teams include your primary Cardiologist (physician) and Advanced Practice Providers (APPs -  Physician Assistants and Nurse Practitioners) who all work together to provide you with the care you need, when you need it.  Your next appointment:   6 month(s)  Provider:   Canary Brim, NP

## 2024-01-02 ENCOUNTER — Ambulatory Visit: Payer: Medicare Other

## 2024-01-02 DIAGNOSIS — I442 Atrioventricular block, complete: Secondary | ICD-10-CM

## 2024-01-02 LAB — CUP PACEART REMOTE DEVICE CHECK
Battery Remaining Longevity: 128 mo
Battery Remaining Percentage: 95.5 %
Battery Voltage: 3.02 V
Brady Statistic AP VP Percent: 1 %
Brady Statistic AP VS Percent: 47 %
Brady Statistic AS VP Percent: 1 %
Brady Statistic AS VS Percent: 53 %
Brady Statistic RA Percent Paced: 46 %
Brady Statistic RV Percent Paced: 1 %
Date Time Interrogation Session: 20250130020013
Implantable Lead Connection Status: 753985
Implantable Lead Connection Status: 753985
Implantable Lead Implant Date: 20240731
Implantable Lead Implant Date: 20240731
Implantable Lead Location: 753859
Implantable Lead Location: 753860
Implantable Pulse Generator Implant Date: 20240731
Lead Channel Impedance Value: 460 Ohm
Lead Channel Impedance Value: 510 Ohm
Lead Channel Pacing Threshold Amplitude: 0.5 V
Lead Channel Pacing Threshold Amplitude: 0.875 V
Lead Channel Pacing Threshold Pulse Width: 0.5 ms
Lead Channel Pacing Threshold Pulse Width: 0.5 ms
Lead Channel Sensing Intrinsic Amplitude: 3.2 mV
Lead Channel Sensing Intrinsic Amplitude: 9.4 mV
Lead Channel Setting Pacing Amplitude: 1.125
Lead Channel Setting Pacing Amplitude: 1.5 V
Lead Channel Setting Pacing Pulse Width: 0.5 ms
Lead Channel Setting Sensing Sensitivity: 2 mV
Pulse Gen Model: 2272
Pulse Gen Serial Number: 5819091

## 2024-02-07 ENCOUNTER — Ambulatory Visit: Payer: Medicare Other | Attending: Physician Assistant | Admitting: Physician Assistant

## 2024-02-07 VITALS — BP 110/65 | HR 72 | Ht 59.0 in | Wt 148.4 lb

## 2024-02-07 DIAGNOSIS — K869 Disease of pancreas, unspecified: Secondary | ICD-10-CM

## 2024-02-07 DIAGNOSIS — Z952 Presence of prosthetic heart valve: Secondary | ICD-10-CM | POA: Diagnosis not present

## 2024-02-07 DIAGNOSIS — I10 Essential (primary) hypertension: Secondary | ICD-10-CM | POA: Diagnosis not present

## 2024-02-07 DIAGNOSIS — Z950 Presence of cardiac pacemaker: Secondary | ICD-10-CM

## 2024-02-07 MED ORDER — FUROSEMIDE 40 MG PO TABS: 20.0000 mg | ORAL_TABLET | Freq: Every day | ORAL | Status: AC | PRN

## 2024-02-07 NOTE — Progress Notes (Signed)
 Remote pacemaker transmission.

## 2024-02-07 NOTE — Patient Instructions (Addendum)
 Medication Instructions:  Your physician has recommended you make the following change in your medication:   CHANGE the Lasix to taking 1/2 tablet only as needed for weight gain of 3 lbs in 24 hours or 5 lbs in 1 weeks  *If you need a refill on your cardiac medications before your next appointment, please call your pharmacy*   Lab Work: None ordered  If you have labs (blood work) drawn today and your tests are completely normal, you will receive your results only by: MyChart Message (if you have MyChart) OR A paper copy in the mail If you have any lab test that is abnormal or we need to change your treatment, we will call you to review the results.   Testing/Procedures: None ordered    Follow-Up: At [facility], you and your health needs are our priority.  As part of our continuing mission to provide you with exceptional heart care, we have created designated Provider Care Teams.  These Care Teams include your primary Cardiologist (physician) and Advanced Practice Providers (APPs -  Physician Assistants and Nurse Practitioners) who all work together to provide you with the care you need, when you need it.  We recommend signing up for the patient portal called "MyChart".  Sign up information is provided on this After Visit Summary.  MyChart is used to connect with patients for Virtual Visits (Telemedicine).  Patients are able to view lab/test results, encounter notes, upcoming appointments, etc.  Non-urgent messages can be sent to your provider as well.   To learn more about what you can do with MyChart, go to [email or web address].    Your next appointment:   As scheduled   Provider:   Carlean Jews, PA-C    Other Instructions     1st Floor: - Lobby - Registration  - Pharmacy  - Lab - Cafe  2nd Floor: - PV Lab - Diagnostic Testing (echo, CT, nuclear med)  3rd Floor: - Vacant  4th Floor: - TCTS (cardiothoracic surgery) - AFib Clinic - Structural Heart  Clinic - Vascular Surgery  - Vascular Ultrasound  5th Floor: - HeartCare Cardiology (general and EP) - Clinical Pharmacy for coumadin, hypertension, lipid, weight-loss medications, and med management appointments    Valet parking services will be available as well.

## 2024-02-07 NOTE — Progress Notes (Signed)
 HEART AND VASCULAR CENTER   MULTIDISCIPLINARY HEART VALVE CLINIC                                     Cardiology Office Note:    Date:  02/07/2024   ID:  Krystal Chambers, DOB 1935/03/17, MRN [id number]  PCP:  Daisy Floro, MD  [facility] Cardiologist:  Elder Negus, MD  / Dr. Clifton James & Dr. Laneta Simmers (TAVR)  [facility] Electrophysiologist:  Will Jorja Loa, MD   Referring MD: Daisy Floro, MD   Follow up.   History of Present Illness:    Krystal Chambers is a 89 y.o. female with a hx of HTN, HLD, thyroid disease, breast cancer s/p mastectomy, obesity (BMI 32), limited mobility and severe aortic valve stenosis s/p TAVR (07/02/23) c/b CHB s/p PPM (07/03/23) who presents to clinic for follow up.  She was referred to Dr. Rosemary Holms for evaluation of a heart murmur and reported shortness of breath on exertion. A 2D echocardiogram on 04/18/2023 showed EF 60%, severe LVH, and severe AS with a mean grad 51 mmHg, AVA 0.9 cm2, mild AI/MR. She underwent cardiac catheterization on 05/07/2023 showing no obstructive coronary disease with normal filling pressures. S/p TAVR with a 26 mm Medtronic Evolut FX THV via the TF approach on 07/02/23. Post operative echo showed EF 60%, normally functioning TAVR with a mean gradient of 6.2 mmHg and no PVL. She developed intermittent CHB and underwent placement of an Abbott Assurity [id number] dual-chamber pacemaker by Dr. Elberta Fortis on 07/03/23. She was started on a baby aspirin 81 mg daily.   Today the patient presents to clinic for follow up. Here with daughter. No CP or SOB. No LE edema, orthopnea or PND. No dizziness or syncope. No blood in stool or urine. No palpitations.   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   s/p TAVR with a 26 mm Medtronic Evolut FX via the TF approach by Drs Clifton James &  Bartle   Severe aortic stenosis    Thyroid disease     Past Surgical History:  Procedure Laterality Date   ABDOMINAL HYSTERECTOMY     pt unsure if she's had a hysterectomy on 07/31/2017   APPENDECTOMY     BREAST BIOPSY Right 1997 X 2   CATARACT EXTRACTION W/ INTRAOCULAR LENS IMPLANT Right ~ 2015   CHOLECYSTECTOMY OPEN     FRACTURE SURGERY     INGUINAL HERNIA REPAIR Right    "not sure if it was a hernia or cyst; felt like sand; had OR; scar still there" (07/31/2017)   INTRAMEDULLARY (IM) NAIL INTERTROCHANTERIC Left 07/30/2017   INTRAMEDULLARY (IM) NAIL INTERTROCHANTERIC Left 07/30/2017   Procedure: INTRAMEDULLARY (IM) NAIL INTERTROCHANTRIC;  Surgeon: Toni Arthurs, MD;  Location: [facility] OR;  Service: Orthopedics;  Laterality: Left;   INTRAOPERATIVE TRANSTHORACIC ECHOCARDIOGRAM N/A 07/02/2023   Procedure: INTRAOPERATIVE TRANSTHORACIC ECHOCARDIOGRAM;  Surgeon: Kathleene Hazel, MD;  Location: [facility] INVASIVE CV LAB;  Service: Open Heart Surgery;  Laterality: N/A;   MASTECTOMY Right 06/18/96   PACEMAKER IMPLANT N/A 07/03/2023   Procedure: PACEMAKER IMPLANT;  Surgeon: Regan Lemming, MD;  Location: [facility] INVASIVE CV LAB;  Service: Cardiovascular;  Laterality: N/A;   RIGHT HEART CATH AND CORONARY ANGIOGRAPHY N/A 05/07/2023  Procedure: RIGHT HEART CATH AND CORONARY ANGIOGRAPHY;  Surgeon: Elder Negus, MD;  Location: [facility] INVASIVE CV LAB;  Service: Cardiovascular;  Laterality: N/A;   TONSILLECTOMY AND ADENOIDECTOMY     TRANSCATHETER AORTIC VALVE REPLACEMENT, TRANSFEMORAL N/A 07/02/2023   Procedure: Transcatheter Aortic Valve Replacement, Transfemoral;  Surgeon: Kathleene Hazel, MD;  Location: [facility] INVASIVE CV LAB;  Service: Open Heart Surgery;  Laterality: N/A;    Current Medications: Current Meds  Medication Sig   acetaminophen (TYLENOL) 325 MG tablet Take 2 tablets (650 mg total) by mouth every 6 (six) hours as needed for mild pain (or Fever >/= 101).   amLODipine (NORVASC) 10 MG  tablet Take 10 mg by mouth in the morning.   aspirin EC 81 MG tablet Take 1 tablet (81 mg total) by mouth daily. Swallow whole.   carvedilol (COREG) 6.25 MG tablet Take 6.25 mg by mouth 2 (two) times daily with a meal.   Cholecalciferol (VITAMIN D3) 50 MCG (2000 UT) capsule Take 2,000 Units by mouth in the morning.   CRESTOR 20 MG tablet Take 20 mg by mouth in the morning.   famotidine (PEPCID) 10 MG tablet Take 10 mg by mouth as needed for heartburn or indigestion.   hydrochlorothiazide (HYDRODIURIL) 25 MG tablet Take 25 mg by mouth every morning.   lisinopril (ZESTRIL) 40 MG tablet Take 40 mg by mouth in the morning.   Multiple Vitamin (MULTIVITAMIN) capsule Take 1 capsule by mouth in the morning.   [DISCONTINUED] furosemide (LASIX) 40 MG tablet TAKE 1 TABLET BY MOUTH EVERY DAY (Patient taking differently: Take 20 mg by mouth daily as needed for fluid or edema.)     Allergies:   Patient has no known allergies.   Social History   Socioeconomic History   Marital status: Widowed    Spouse name: Not on file   Number of children: 3   Years of education: Not on file   Highest education level: Not on file  Occupational History   Occupation: Retired-nursing Geophysicist/field seismologist  Tobacco Use   Smoking status: Never   Smokeless tobacco: Never  Vaping Use   Vaping status: Never Used  Substance and Sexual Activity   Alcohol use: No   Drug use: No   Sexual activity: Never  Other Topics Concern   Not on file  Social History Narrative   Not on file   Social Drivers of Health   Financial Resource Strain: Not on file  Food Insecurity: No Food Insecurity (07/04/2023)   Hunger Vital Sign    Worried About Running Out of Food in the Last Year: Never true    Ran Out of Food in the Last Year: Never true  Transportation Needs: No Transportation Needs (07/04/2023)   PRAPARE - Administrator, Civil Service (Medical): No    Lack of Transportation (Non-Medical): No  Physical Activity: Not on file   Stress: Not on file  Social Connections: Not on file     Family History: The patient's family history includes Heart disease in her father and mother.  ROS:   Please see the history of present illness.    All other systems reviewed and are negative.  EKGs/Labs/Other Studies Reviewed:      EKG:  EKG is NOT ordered today.    Recent Labs: 04/09/2023: NT-Pro BNP 89 06/28/2023: ALT 12 07/04/2023: BUN 11; Creatinine, Ser 1.02; Hemoglobin 11.7; Magnesium 3.1; Platelets 94; Potassium 4.0; Sodium 131  Recent Lipid Panel No results found for: "CHOL", "TRIG", "HDL", "  CHOLHDL", "VLDL", "LDLCALC", "LDLDIRECT"   Risk Assessment/Calculations:           Physical Exam:    VS:  BP 110/65 (BP Location: Right Arm)   Pulse 72   Ht 4\' 11"  (1.499 m)   Wt 148 lb 6.4 oz (67.3 kg)   SpO2 100%   BMI 29.97 kg/m     Wt Readings from Last 3 Encounters:  02/07/24 148 lb 6.4 oz (67.3 kg)  10/18/23 146 lb (66.2 kg)  08/09/23 146 lb 12.8 oz (66.6 kg)     GEN:  Well nourished, well developed in no acute distress HEENT: Normal NECK: No JVD LYMPHATICS: No lymphadenopathy CARDIAC: RRR, no murmurs, rubs, gallops RESPIRATORY:  Clear to auscultation without rales, wheezing or rhonchi  ABDOMEN: Soft, non-tender, non-distended MUSCULOSKELETAL:  mild Left LE edema; No deformity  SKIN: Warm and dry. NEUROLOGIC:  Alert and oriented x 3 PSYCHIATRIC:  Normal affect   ASSESSMENT:    1. S/P TAVR (transcatheter aortic valve replacement)   2. Cardiac pacemaker in situ   3. Essential hypertension   4. Lesion of pancreas      PLAN:    In order of problems listed above:  Severe AS s/p TAVR:  -- Continue baby aspirin 81 mg daily.  -- SBE prophylaxis discussed; the patient is edentulous and does not go to the dentist.  -- I will see her back in 1 year for echo and follow up in 06/2024.   CHB s/p PPM:  -- Followed by by Dr. Elberta Fortis.   HTN:  -- BP well controlled.  -- Continue Norvac 10mg  daily,  Lisinopril 40mg  daily, hydrochlorothiazide 25 mg daily and Coreg 6.25mg  BID.  -- She has been on Lasix 40mg  daily as well. This was previously as needed (will change back to PRN). -- Getting labs are upcoming PCP appt. Did not want to get a BMET today.   Pancreatic lesions:  -- Pre TAVR CT showed multiple cystic pancreatic lesions, largest is a lesion of the pancreatic body measuring 1.9 cm. -- Recommend follow-up with MRCP in 2 years. -- This was printed out of her and deferred to her PCP.    Medication Adjustments/Labs and Tests Ordered: Current medicines are reviewed at length with the patient today.  Concerns regarding medicines are outlined above.  No orders of the defined types were placed in this encounter.  Meds ordered this encounter  Medications   furosemide (LASIX) 40 MG tablet    Sig: Take 0.5 tablets (20 mg total) by mouth daily as needed for fluid or edema.    Patient Instructions  Medication Instructions:  Your physician has recommended you make the following change in your medication:   CHANGE the Lasix to taking 1/2 tablet only as needed for weight gain of 3 lbs in 24 hours or 5 lbs in 1 weeks  *If you need a refill on your cardiac medications before your next appointment, please call your pharmacy*   Lab Work: None ordered  If you have labs (blood work) drawn today and your tests are completely normal, you will receive your results only by: MyChart Message (if you have MyChart) OR A paper copy in the mail If you have any lab test that is abnormal or we need to change your treatment, we will call you to review the results.   Testing/Procedures: None ordered    Follow-Up: At [facility], you and your health needs are our priority.  As part of our continuing mission to  provide you with exceptional heart care, we have created designated Provider Care Teams.  These Care Teams include your primary Cardiologist (physician) and Advanced Practice Providers  (APPs -  Physician Assistants and Nurse Practitioners) who all work together to provide you with the care you need, when you need it.  We recommend signing up for the patient portal called "MyChart".  Sign up information is provided on this After Visit Summary.  MyChart is used to connect with patients for Virtual Visits (Telemedicine).  Patients are able to view lab/test results, encounter notes, upcoming appointments, etc.  Non-urgent messages can be sent to your provider as well.   To learn more about what you can do with MyChart, go to [email or web address].    Your next appointment:   As scheduled   Provider:   Carlean Jews, PA-C    Other Instructions     1st Floor: - Lobby - Registration  - Pharmacy  - Lab - Cafe  2nd Floor: - PV Lab - Diagnostic Testing (echo, CT, nuclear med)  3rd Floor: - Vacant  4th Floor: - TCTS (cardiothoracic surgery) - AFib Clinic - Structural Heart Clinic - Vascular Surgery  - Vascular Ultrasound  5th Floor: - HeartCare Cardiology (general and EP) - Clinical Pharmacy for coumadin, hypertension, lipid, weight-loss medications, and med management appointments    Valet parking services will be available as well.         Signed, Cline Crock, PA-C  02/07/2024 12:47 PM    [facility]

## 2024-02-07 NOTE — Addendum Note (Signed)
 Addended by: Janetta Hora on: 02/07/2024 12:53 PM   Modules accepted: Orders

## 2024-04-02 ENCOUNTER — Ambulatory Visit (INDEPENDENT_AMBULATORY_CARE_PROVIDER_SITE_OTHER): Payer: Medicare Other

## 2024-04-02 DIAGNOSIS — I442 Atrioventricular block, complete: Secondary | ICD-10-CM | POA: Diagnosis not present

## 2024-04-02 LAB — CUP PACEART REMOTE DEVICE CHECK
Battery Remaining Longevity: 124 mo
Battery Remaining Percentage: 95.5 %
Battery Voltage: 3.02 V
Brady Statistic AP VP Percent: 1 %
Brady Statistic AP VS Percent: 49 %
Brady Statistic AS VP Percent: 1 %
Brady Statistic AS VS Percent: 51 %
Brady Statistic RA Percent Paced: 48 %
Brady Statistic RV Percent Paced: 1 %
Date Time Interrogation Session: 20250501020016
Implantable Lead Connection Status: 753985
Implantable Lead Connection Status: 753985
Implantable Lead Implant Date: 20240731
Implantable Lead Implant Date: 20240731
Implantable Lead Location: 753859
Implantable Lead Location: 753860
Implantable Pulse Generator Implant Date: 20240731
Lead Channel Impedance Value: 450 Ohm
Lead Channel Impedance Value: 510 Ohm
Lead Channel Pacing Threshold Amplitude: 0.5 V
Lead Channel Pacing Threshold Amplitude: 0.875 V
Lead Channel Pacing Threshold Pulse Width: 0.5 ms
Lead Channel Pacing Threshold Pulse Width: 0.5 ms
Lead Channel Sensing Intrinsic Amplitude: 11.2 mV
Lead Channel Sensing Intrinsic Amplitude: 2.6 mV
Lead Channel Setting Pacing Amplitude: 1.125
Lead Channel Setting Pacing Amplitude: 1.5 V
Lead Channel Setting Pacing Pulse Width: 0.5 ms
Lead Channel Setting Sensing Sensitivity: 2 mV
Pulse Gen Model: 2272
Pulse Gen Serial Number: 5819091

## 2024-04-29 ENCOUNTER — Ambulatory Visit: Admitting: Podiatry

## 2024-05-04 ENCOUNTER — Ambulatory Visit: Admitting: Podiatry

## 2024-05-14 NOTE — Progress Notes (Signed)
 Remote pacemaker transmission.

## 2024-05-27 ENCOUNTER — Encounter: Payer: Self-pay | Admitting: Podiatry

## 2024-05-27 ENCOUNTER — Ambulatory Visit: Admitting: Podiatry

## 2024-05-27 ENCOUNTER — Ambulatory Visit (INDEPENDENT_AMBULATORY_CARE_PROVIDER_SITE_OTHER)

## 2024-05-27 VITALS — Ht 59.0 in | Wt 148.4 lb

## 2024-05-27 DIAGNOSIS — B351 Tinea unguium: Secondary | ICD-10-CM | POA: Diagnosis not present

## 2024-05-27 DIAGNOSIS — M2042 Other hammer toe(s) (acquired), left foot: Secondary | ICD-10-CM

## 2024-05-27 DIAGNOSIS — M79675 Pain in left toe(s): Secondary | ICD-10-CM

## 2024-05-27 DIAGNOSIS — M79674 Pain in right toe(s): Secondary | ICD-10-CM

## 2024-05-27 NOTE — Progress Notes (Signed)
   Chief Complaint  Patient presents with   Hammer Toe    Pt is here due to what she believes is a hammertoe states her second toe curls under and causing some pain also would like her toenails trimmed.    SUBJECTIVE Patient presents to office today complaining of elongated, thickened nails that cause pain while ambulating in shoes.  Patient is unable to trim their own nails. Patient is here for further evaluation and treatment.  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 Derm Skin is dry and supple bilateral. Negative open lesions or macerations. Remaining integument unremarkable. Nails are tender, long, thickened and dystrophic with subungual debris, consistent with onychomycosis, 1-5 bilateral. No signs of infection noted. Vasc  DP and PT pedal pulses palpable bilaterally. Temperature gradient within normal limits.  Neuro grossly intact via light touch Musculoskeletal Exam No symptomatic pedal deformities noted bilateral. Muscular strength within normal limits. Radiographic exam LT foot 05/27/2024 Hammertoe contracture deformity noted to the lesser digits.  No acute fracture identified  ASSESSMENT 1.  Pain due to onychomycosis of toenails both 2.  Hammertoe second digit left foot PLAN OF CARE 1. Patient evaluated today.  2. Instructed to maintain good pedal hygiene and foot care.  3. Mechanical debridement of nails 1-5 bilaterally performed using a nail nipper. Filed with dremel without incident.  4. Return to clinic in 3 mos.    Thresa EMERSON Sar, DPM [facility]  Dr.  Thresa EMERSON Sar, DPM    [address] [place], [place] [address]                Office [phone]  Fax [phone]

## 2024-06-11 ENCOUNTER — Ambulatory Visit: Admitting: Physician Assistant

## 2024-06-11 ENCOUNTER — Ambulatory Visit
Admission: RE | Admit: 2024-06-11 | Discharge: 2024-06-11 | Disposition: A | Discharge status: Home / Self Care | Source: Ambulatory Visit | Attending: Internal Medicine | Admitting: Internal Medicine

## 2024-06-11 ENCOUNTER — Ambulatory Visit: Payer: Self-pay | Admitting: Physician Assistant

## 2024-06-11 VITALS — BP 138/72 | HR 60 | Ht 59.0 in | Wt 145.8 lb

## 2024-06-11 DIAGNOSIS — Z952 Presence of prosthetic heart valve: Secondary | ICD-10-CM | POA: Insufficient documentation

## 2024-06-11 DIAGNOSIS — K869 Disease of pancreas, unspecified: Secondary | ICD-10-CM

## 2024-06-11 DIAGNOSIS — I10 Essential (primary) hypertension: Secondary | ICD-10-CM | POA: Diagnosis present

## 2024-06-11 DIAGNOSIS — Z950 Presence of cardiac pacemaker: Secondary | ICD-10-CM | POA: Diagnosis present

## 2024-06-11 LAB — ECHOCARDIOGRAM COMPLETE
AR max vel: 1.51 cm2
AV Area VTI: 1.49 cm2
AV Area mean vel: 1.56 cm2
AV Mean grad: 6 mmHg
AV Peak grad: 11.2 mmHg
Ao pk vel: 1.67 m/s
Area-P 1/2: 2.38 cm2
S' Lateral: 2.1 cm

## 2024-06-11 NOTE — Patient Instructions (Signed)
 Medication Instructions:  Your physician recommends that you continue on your current medications as directed. Please refer to the Current Medication list given to you today.  *If you need a refill on your cardiac medications before your next appointment, please call your pharmacy*  Lab Work: None needed If you have labs (blood work) drawn today and your tests are completely normal, you will receive your results only by: MyChart Message (if you have MyChart) OR A paper copy in the mail If you have any lab test that is abnormal or we need to change your treatment, we will call you to review the results.  Testing/Procedures: None needed  Follow-Up: At [facility], you and your health needs are our priority.  As part of our continuing mission to provide you with exceptional heart care, our providers are all part of one team.  This team includes your primary Cardiologist (physician) and Advanced Practice Providers or APPs (Physician Assistants and Nurse Practitioners) who all work together to provide you with the care you need, when you need it.  Your next appointment:   As scheduled  Provider:   Newman JINNY Lawrence, MD    We recommend signing up for the patient portal called MyChart.  Sign up information is provided on this After Visit Summary.  MyChart is used to connect with patients for Virtual Visits (Telemedicine).  Patients are able to view lab/test results, encounter notes, upcoming appointments, etc.  Non-urgent messages can be sent to your provider as well.   To learn more about what you can do with MyChart, go to [email or web address].   Other Instructions Please show results from CT scan of pancreatic lesions to Dr. Okey (Results included Below)

## 2024-06-11 NOTE — Progress Notes (Signed)
 HEART AND VASCULAR CENTER   MULTIDISCIPLINARY HEART VALVE CLINIC                                     Cardiology Office Note:    Date:  06/11/2024   ID:  Krystal Chambers, DOB 1935/09/19, MRN [id number]  PCP:  Okey Carlin Redbird, MD  [facility] Cardiologist:  Newman JINNY Lawrence, MD  [facility] Structural heart: Lonni Cash, MD [facility] Electrophysiologist:  Will Gladis Norton, MD   Referring MD: Okey Carlin Redbird, MD   1 year s/p TAVR  History of Present Illness:    Krystal Chambers is a 89 y.o. female with a hx of HTN, HLD, thyroid  disease, breast cancer s/p mastectomy, obesity (BMI 32), limited mobility and severe aortic valve stenosis s/p TAVR (07/02/23) c/b CHB s/p PPM (07/03/23) who presents to clinic for follow up.   She was referred to Dr. Lawrence for evaluation of a heart murmur and reported shortness of breath on exertion. A 2D echocardiogram on 04/18/2023 showed EF 60%, severe LVH, and severe AS with a mean grad 51 mmHg, AVA 0.9 cm2, mild AI/MR. She underwent cardiac catheterization on 05/07/2023 showing no obstructive coronary disease with normal filling pressures. S/p TAVR with a 26 mm Medtronic Evolut FX THV via the TF approach on 07/02/23. Post operative echo showed EF 60%, normally functioning TAVR with a mean gradient of 6.2 mmHg and no PVL. She developed intermittent CHB and underwent placement of an Abbott Assurity [id number] dual-chamber pacemaker by Dr. Norton on 07/03/23. She was started on a baby aspirin  81 mg daily.    Today the patient presents to clinic for follow up. Has swelling in left leg due to orthopedic issues. No CP. Gets mild SOB with moderate exertion but not very active at baseline. No LE edema, orthopnea or PND. No dizziness or syncope. No blood in stool or urine. No palpitations.    Past Medical History:  Diagnosis Date   Arthritis    left knee (07/31/2017)   Cancer of right breast (HCC) 1997   Hypertension    S/P TAVR (transcatheter  aortic valve replacement) 07/02/2023   s/p TAVR with a 26 mm Medtronic Evolut FX via the TF approach by Drs Cash & Bartle   Severe aortic stenosis    Thyroid  disease      Current Medications: Current Meds  Medication Sig   acetaminophen  (TYLENOL ) 325 MG tablet Take 2 tablets (650 mg total) by mouth every 6 (six) hours as needed for mild pain (or Fever >/= 101).   amLODipine  (NORVASC ) 10 MG tablet Take 10 mg by mouth in the morning.   aspirin  EC 81 MG tablet Take 1 tablet (81 mg total) by mouth daily. Swallow whole.   carvedilol  (COREG ) 6.25 MG tablet Take 6.25 mg by mouth 2 (two) times daily with a meal.   Cholecalciferol (VITAMIN D3) 50 MCG (2000 UT) capsule Take 2,000 Units by mouth in the morning.   CRESTOR  20 MG tablet Take 20 mg by mouth in the morning.   famotidine (PEPCID) 10 MG tablet Take 10 mg by mouth as needed for heartburn or indigestion.   FARXIGA 5 MG TABS tablet Take 5 mg by mouth daily.   furosemide  (LASIX ) 40 MG tablet Take 0.5 tablets (20 mg total) by mouth daily as needed for fluid or edema.   hydrochlorothiazide  (HYDRODIURIL ) 25 MG tablet Take 25 mg by  mouth every morning.   lisinopril  (ZESTRIL ) 40 MG tablet Take 40 mg by mouth in the morning.   Multiple Vitamin (MULTIVITAMIN) capsule Take 1 capsule by mouth in the morning.   repaglinide (PRANDIN) 0.5 MG tablet Take 0.5 mg by mouth 2 (two) times daily.      ROS:   Please see the history of present illness.    All other systems reviewed and are negative.  EKGs       Risk Assessment/Calculations:           Physical Exam:    VS:  BP 138/72   Pulse 60   Ht 4' 11 (1.499 m)   Wt 145 lb 12.8 oz (66.1 kg)   BMI 29.45 kg/m     Wt Readings from Last 3 Encounters:  06/11/24 145 lb 12.8 oz (66.1 kg)  05/27/24 148 lb 6.4 oz (67.3 kg)  02/07/24 148 lb 6.4 oz (67.3 kg)     GEN: Well nourished, well developed in no acute distress NECK: No JVD CARDIAC: RRR, no murmurs, rubs, gallops RESPIRATORY:   Clear to auscultation without rales, wheezing or rhonchi  ABDOMEN: Soft, non-tender, non-distended EXTREMITIES:  No edema; No deformity.    ASSESSMENT:    1. S/P TAVR (transcatheter aortic valve replacement)   2. Cardiac pacemaker in situ   3. Essential hypertension   4. Lesion of pancreas     PLAN:    In order of problems listed above:  Severe AS s/p TAVR:  -- Echo today shows EF 55%, normally functioning TAVR with a mean gradient of 6 mm hg and no PVL.  -- NYHA class I symptoms.  -- Continue Aspirin  81mg  daily.  -- SBE prophylaxis discussed; the patient is edentulous and does not go to the dentist.  -- Continue regular follow up with Dr. Elmira  CHB s/p PPM:  -- Followed by by Dr. Inocencio.   HTN:  -- BP well controlled.  -- Continue Norvac 10mg  daily, Lisinopril  40mg  daily, hydrochlorothiazide  25 mg daily and Coreg  6.25mg  BID.  -- She has Lasix  20mg  to take as needed.  -- Labs followed by Dr. Okey (PCP).   Pancreatic lesions:  -- Pre TAVR CT showed multiple cystic pancreatic lesions, largest is a lesion of the pancreatic body measuring 1.9 cm. -- Recommend follow-up with MRCP in 2 years (due next year). -- This was printed out of her and deferred to her PCP.     Medication Adjustments/Labs and Tests Ordered: Current medicines are reviewed at length with the patient today.  Concerns regarding medicines are outlined above.  No orders of the defined types were placed in this encounter.  No orders of the defined types were placed in this encounter.   Patient Instructions  Medication Instructions:  Your physician recommends that you continue on your current medications as directed. Please refer to the Current Medication list given to you today.  *If you need a refill on your cardiac medications before your next appointment, please call your pharmacy*  Lab Work: None needed If you have labs (blood work) drawn today and your tests are completely normal, you will  receive your results only by: MyChart Message (if you have MyChart) OR A paper copy in the mail If you have any lab test that is abnormal or we need to change your treatment, we will call you to review the results.  Testing/Procedures: None needed  Follow-Up: At [facility], you and your health needs are our priority.  As part  of our continuing mission to provide you with exceptional heart care, our providers are all part of one team.  This team includes your primary Cardiologist (physician) and Advanced Practice Providers or APPs (Physician Assistants and Nurse Practitioners) who all work together to provide you with the care you need, when you need it.  Your next appointment:   As scheduled  Provider:   Newman JINNY Lawrence, MD    We recommend signing up for the patient portal called MyChart.  Sign up information is provided on this After Visit Summary.  MyChart is used to connect with patients for Virtual Visits (Telemedicine).  Patients are able to view lab/test results, encounter notes, upcoming appointments, etc.  Non-urgent messages can be sent to your provider as well.   To learn more about what you can do with MyChart, go to [email or web address].   Other Instructions Please show results from CT scan of pancreatic lesions to Dr. Okey (Results included Below)       Signed, Lamarr Hummer, PA-C  06/11/2024 8:21 PM    [facility]

## 2024-07-02 ENCOUNTER — Ambulatory Visit: Payer: Medicare Other

## 2024-07-02 DIAGNOSIS — I442 Atrioventricular block, complete: Secondary | ICD-10-CM

## 2024-07-02 LAB — CUP PACEART REMOTE DEVICE CHECK
Battery Remaining Longevity: 121 mo
Battery Remaining Percentage: 94 %
Battery Voltage: 3.01 V
Brady Statistic AP VP Percent: 1 %
Brady Statistic AP VS Percent: 52 %
Brady Statistic AS VP Percent: 1 %
Brady Statistic AS VS Percent: 48 %
Brady Statistic RA Percent Paced: 51 %
Brady Statistic RV Percent Paced: 1 %
Date Time Interrogation Session: 20250731020016
Implantable Lead Connection Status: 753985
Implantable Lead Connection Status: 753985
Implantable Lead Implant Date: 20240731
Implantable Lead Implant Date: 20240731
Implantable Lead Location: 753859
Implantable Lead Location: 753860
Implantable Pulse Generator Implant Date: 20240731
Lead Channel Impedance Value: 430 Ohm
Lead Channel Impedance Value: 480 Ohm
Lead Channel Pacing Threshold Amplitude: 0.5 V
Lead Channel Pacing Threshold Amplitude: 0.875 V
Lead Channel Pacing Threshold Pulse Width: 0.5 ms
Lead Channel Pacing Threshold Pulse Width: 0.5 ms
Lead Channel Sensing Intrinsic Amplitude: 1 mV
Lead Channel Sensing Intrinsic Amplitude: 9.4 mV
Lead Channel Setting Pacing Amplitude: 1.125
Lead Channel Setting Pacing Amplitude: 1.5 V
Lead Channel Setting Pacing Pulse Width: 0.5 ms
Lead Channel Setting Sensing Sensitivity: 2 mV
Pulse Gen Model: 2272
Pulse Gen Serial Number: 5819091

## 2024-07-03 ENCOUNTER — Ambulatory Visit: Payer: Self-pay | Admitting: Cardiology

## 2024-09-02 NOTE — Progress Notes (Signed)
 Remote PPM Transmission

## 2024-09-15 ENCOUNTER — Ambulatory Visit: Admitting: Podiatry

## 2024-10-01 ENCOUNTER — Ambulatory Visit: Payer: Medicare Other

## 2024-10-01 DIAGNOSIS — I442 Atrioventricular block, complete: Secondary | ICD-10-CM | POA: Diagnosis not present

## 2024-10-01 LAB — CUP PACEART REMOTE DEVICE CHECK
Battery Remaining Longevity: 118 mo
Battery Remaining Percentage: 92 %
Battery Voltage: 3.01 V
Brady Statistic AP VP Percent: 1 %
Brady Statistic AP VS Percent: 49 %
Brady Statistic AS VP Percent: 1 %
Brady Statistic AS VS Percent: 51 %
Brady Statistic RA Percent Paced: 48 %
Brady Statistic RV Percent Paced: 1 %
Date Time Interrogation Session: 20251030020017
Implantable Lead Connection Status: 753985
Implantable Lead Connection Status: 753985
Implantable Lead Implant Date: 20240731
Implantable Lead Implant Date: 20240731
Implantable Lead Location: 753859
Implantable Lead Location: 753860
Implantable Pulse Generator Implant Date: 20240731
Lead Channel Impedance Value: 430 Ohm
Lead Channel Impedance Value: 480 Ohm
Lead Channel Pacing Threshold Amplitude: 0.625 V
Lead Channel Pacing Threshold Amplitude: 0.75 V
Lead Channel Pacing Threshold Pulse Width: 0.5 ms
Lead Channel Pacing Threshold Pulse Width: 0.5 ms
Lead Channel Sensing Intrinsic Amplitude: 1.8 mV
Lead Channel Sensing Intrinsic Amplitude: 11.4 mV
Lead Channel Setting Pacing Amplitude: 1 V
Lead Channel Setting Pacing Amplitude: 1.625
Lead Channel Setting Pacing Pulse Width: 0.5 ms
Lead Channel Setting Sensing Sensitivity: 2 mV
Pulse Gen Model: 2272
Pulse Gen Serial Number: 5819091

## 2024-10-02 ENCOUNTER — Ambulatory Visit: Payer: Self-pay | Admitting: Cardiology

## 2024-10-07 NOTE — Progress Notes (Signed)
 Remote PPM Transmission

## 2024-11-20 ENCOUNTER — Ambulatory Visit: Attending: Cardiology | Admitting: Cardiology

## 2024-11-20 ENCOUNTER — Encounter: Payer: Self-pay | Admitting: Cardiology

## 2024-11-20 VITALS — BP 174/86 | HR 70 | Ht <= 58 in | Wt 150.1 lb

## 2024-11-20 DIAGNOSIS — I10 Essential (primary) hypertension: Secondary | ICD-10-CM

## 2024-11-20 DIAGNOSIS — I442 Atrioventricular block, complete: Secondary | ICD-10-CM | POA: Diagnosis not present

## 2024-11-20 DIAGNOSIS — Z952 Presence of prosthetic heart valve: Secondary | ICD-10-CM

## 2024-11-20 DIAGNOSIS — E782 Mixed hyperlipidemia: Secondary | ICD-10-CM

## 2024-11-20 MED ORDER — HYDROCHLOROTHIAZIDE 25 MG PO TABS: 25.0000 mg | ORAL_TABLET | Freq: Every morning | ORAL | 2 refills | Status: AC

## 2024-11-20 NOTE — Patient Instructions (Addendum)
 Medication Instructions:    Restart taking Hydrochlorothiazide  25 mg   *If you need a refill on your cardiac medications before your next appointment, please call your pharmacy*   Lab Work: in one week   BMP  LIPID  If you have labs (blood work) drawn today and your tests are completely normal, you will receive your results only by: MyChart Message (if you have MyChart) OR A paper copy in the mail If you have any lab test that is abnormal or we need to change your treatment, we will call you to review the results.   Testing/Procedures:  Not needed  Follow-Up: At [facility], you and your health needs are our priority.  As part of our continuing mission to provide you with exceptional heart care, we have created designated Provider Care Teams.  These Care Teams include your primary Cardiologist (physician) and Advanced Practice Providers (APPs -  Physician Assistants and Nurse Practitioners) who all work together to provide you with the care you need, when you need it.     Your next appointment:   4 week(s)  The format for your next appointment:   In Person  Provider:   patient is to see one or the other  in 4 weeks   Pharm- [facility] or  One of our Advanced Practice Providers (APPs):   Morse Clause, PA-C  Lamarr Satterfield, NP Miriam Shams, NP  Olivia Pavy, PA-C Josefa Beauvais, NP  Leontine Salen, PA-C Orren Fabry, PA-C  [place], PA-C Ernest Dick, NP  Damien Braver, NP Jon Hails, PA-C  Waddell Donath, PA-C    Dayna Dunn, PA-C  Scott Weaver, PA-C Lum Louis, NP Katlyn West, NP Callie Goodrich, PA-C  Xika Zhao, NP Sheng Haley, PA-C    Kathleen Johnson, PA-C   Then, Newman JINNY Lawrence, MD will plan to see you again in 6 month(s).   Other Instructions

## 2024-11-20 NOTE — Progress Notes (Signed)
 " Cardiology Office Note:  .   Date:  11/20/2024  ID:  Krystal Chambers, DOB 1935-02-17, MRN [id number] PCP: No primary care provider on file.  [facility] Providers Cardiologist:  Newman Lawrence, MD PCP: No primary care provider on file.  Chief Complaint  Patient presents with   Hypertension     Krystal Chambers is a 89 y.o. female with hypertension, hyperlipidemia, s/p bilateral mastectomy for breast cancer, s/p TAVR for severe AS 06/2023, s/p PPM for complete heart block post-TAVR  History of Present Illness  Patient is here today with her daughter.  She is doing well since TAVR, gets around well at home with the use of cane.  Blood pressure is elevated today on 2 separate checks.  She has been off hydrochlorothiazide  for last several months.     Vitals:   11/20/24 1350  BP: (!) 183/84  Pulse: 70  SpO2: 94%      Review of Systems  Cardiovascular:  Negative for chest pain, dyspnea on exertion, leg swelling, palpitations and syncope.        Studies Reviewed: SABRA        EKG 11/20/2024: Normal sinus rhythm Left axis deviation Left bundle branch block When compared with ECG of 17-Jul-2023 14:44, No significant change was found   Device check 09/2024: Remote pacemaker interrogation. Presenting Rhythm:A-paced V-sensed. Battery and lead parameters stable with stable capture and sensing. Device programming is appropriate. Continue remote monitoring.   Echocardiogram 06/2024:  1. Left ventricular ejection fraction, by estimation, is 55 to 60%. Left  ventricular ejection fraction by 3D volume is 78 %. The left ventricle has  normal function. The left ventricle has no regional wall motion  abnormalities. Left ventricular diastolic   parameters are indeterminate.   2. Right ventricular systolic function is normal. The right ventricular  size is normal.   3. The mitral valve is normal in structure. No evidence of mitral valve  regurgitation. No evidence of mitral  stenosis.   4. The aortic valve has been repaired/replaced. Aortic valve  regurgitation is not visualized. No aortic stenosis is present. There is a  26 mm Medtronic Evolut Fx (TAVR) valve present in the aortic position with  a mean gradient of . No PVL.  Procedure Date: 06/05/23.   5. The inferior vena cava is normal in size with greater than 50%  respiratory variability, suggesting right atrial pressure of 3 mmHg.   TAVR 06/2023 (Medtronic evolute FX 26 mm)  Left and right heart catheterization 05/2023: LM: Normal LAD: Aneurysmal prox-mid LAD without significant stenosis Lcx; Normal RCA: Aneurysmal prox-mid RCA without significant stenosis   RA: 2 mmHg RV: 28/6 mmHg PA: 27/13 mmHg, mPAP 19 mmHg PCW: 12 mmHg   CO: 6 L/min CI: 3.7 L/min/m2   No obstructive coronary artery disease Normal filling pressures    Physical Exam Vitals and nursing note reviewed.  Constitutional:      General: She is not in acute distress. Neck:     Vascular: No JVD.  Cardiovascular:     Rate and Rhythm: Normal rate and regular rhythm.     Heart sounds: Normal heart sounds. No murmur heard. Pulmonary:     Effort: Pulmonary effort is normal.     Breath sounds: Normal breath sounds. No wheezing or rales.  Musculoskeletal:     Right lower leg: No edema.     Left lower leg: No edema.      VISIT DIAGNOSES:   ICD-10-CM   1. Essential  hypertension  I10 EKG 12-Lead    hydrochlorothiazide  (HYDRODIURIL ) 25 MG tablet    Basic metabolic panel with GFR    Lipid panel    AMB Referral to [facility] Pharm-D    Lipid panel    Basic metabolic panel with GFR    2. CHB (complete heart block) (HCC)  I44.2 EKG 12-Lead    Lipid panel    Lipid panel    3. Mixed hyperlipidemia  E78.2 Lipid panel    AMB Referral to [facility] Pharm-D    Lipid panel    4. S/P TAVR (transcatheter aortic valve replacement)  Z95.2        Krystal Chambers is a 89 y.o. female with hypertension, hyperlipidemia, s/p  bilateral mastectomy for breast cancer, s/p TAVR for severe AS 06/2023, s/p PPM for complete heart block post-TAVR  Assessment & Plan  S/p TAVR: Continue aspirin  81 mg daily.  Emphasize importance of endocarditis prophylaxis.  Complete AV block: Pacemaker functioning well, also has native conduction.  Hypertension: Uncontrolled. Resume hydrochlorothiazide  25 mg daily.  Encourage liberal hydration.  Check BMP in 1 week. Continue rest of the antihypertensive therapy.       Meds ordered this encounter  Medications   hydrochlorothiazide  (HYDRODIURIL ) 25 MG tablet    Sig: Take 1 tablet (25 mg total) by mouth every morning.    Dispense:  90 tablet    Refill:  2     F/u in 4 weeks to reassess blood pressure control  Signed, Newman JINNY Lawrence, MD  "

## 2024-11-30 LAB — BASIC METABOLIC PANEL WITH GFR
BUN/Creatinine Ratio: 11 — ABNORMAL LOW (ref 12–28)
BUN: 14 mg/dL (ref 8–27)
CO2: 25 mmol/L (ref 20–29)
Calcium: 11.4 mg/dL — ABNORMAL HIGH (ref 8.7–10.3)
Chloride: 107 mmol/L — ABNORMAL HIGH (ref 96–106)
Creatinine, Ser: 1.31 mg/dL — ABNORMAL HIGH (ref 0.57–1.00)
Glucose: 105 mg/dL — ABNORMAL HIGH (ref 70–99)
Potassium: 3.8 mmol/L (ref 3.5–5.2)
Sodium: 147 mmol/L — ABNORMAL HIGH (ref 134–144)
eGFR: 39 mL/min/1.73 — ABNORMAL LOW

## 2024-11-30 LAB — LIPID PANEL
Chol/HDL Ratio: 3 ratio (ref 0.0–4.4)
Cholesterol, Total: 157 mg/dL (ref 100–199)
HDL: 53 mg/dL
LDL Chol Calc (NIH): 85 mg/dL (ref 0–99)
Triglycerides: 102 mg/dL (ref 0–149)
VLDL Cholesterol Cal: 19 mg/dL (ref 5–40)

## 2024-12-01 ENCOUNTER — Ambulatory Visit: Payer: Self-pay | Admitting: Cardiology

## 2024-12-01 DIAGNOSIS — R7989 Other specified abnormal findings of blood chemistry: Secondary | ICD-10-CM

## 2024-12-09 LAB — BASIC METABOLIC PANEL WITH GFR
BUN/Creatinine Ratio: 13 (ref 12–28)
BUN: 17 mg/dL (ref 8–27)
CO2: 27 mmol/L (ref 20–29)
Calcium: 11.3 mg/dL — ABNORMAL HIGH (ref 8.7–10.3)
Chloride: 101 mmol/L (ref 96–106)
Creatinine, Ser: 1.3 mg/dL — ABNORMAL HIGH (ref 0.57–1.00)
Glucose: 110 mg/dL — ABNORMAL HIGH (ref 70–99)
Potassium: 3.6 mmol/L (ref 3.5–5.2)
Sodium: 143 mmol/L (ref 134–144)
eGFR: 40 mL/min/1.73 — ABNORMAL LOW

## 2024-12-24 NOTE — Progress Notes (Unsigned)
 "   Cardiology Clinic Note   Patient Name: Krystal Chambers Date of Encounter: 12/24/2024  Primary Care Provider:  Alvia Corean CROME, FNP Primary Cardiologist:  Newman JINNY Lawrence, MD  Patient Profile    ***  Past Medical History    Past Medical History:  Diagnosis Date   Arthritis    left knee (07/31/2017)   Cancer of right breast (HCC) 1997   Hypertension    S/P TAVR (transcatheter aortic valve replacement) 07/02/2023   s/p TAVR with a 26 mm Medtronic Evolut FX via the TF approach by Drs Verlin & Bartle   Severe aortic stenosis    Thyroid  disease    Past Surgical History:  Procedure Laterality Date   ABDOMINAL HYSTERECTOMY     pt unsure if she's had a hysterectomy on 07/31/2017   APPENDECTOMY     BREAST BIOPSY Right 1997 X 2   CATARACT EXTRACTION W/ INTRAOCULAR LENS IMPLANT Right ~ 2015   CHOLECYSTECTOMY OPEN     FRACTURE SURGERY     INGUINAL HERNIA REPAIR Right    not sure if it was a hernia or cyst; felt like sand; had OR; scar still there (07/31/2017)   INTRAMEDULLARY (IM) NAIL INTERTROCHANTERIC Left 07/30/2017   INTRAMEDULLARY (IM) NAIL INTERTROCHANTERIC Left 07/30/2017   Procedure: INTRAMEDULLARY (IM) NAIL INTERTROCHANTRIC;  Surgeon: Kit Rush, MD;  Location: [facility] OR;  Service: Orthopedics;  Laterality: Left;   INTRAOPERATIVE TRANSTHORACIC ECHOCARDIOGRAM N/A 07/02/2023   Procedure: INTRAOPERATIVE TRANSTHORACIC ECHOCARDIOGRAM;  Surgeon: Verlin Lonni BIRCH, MD;  Location: [facility] INVASIVE CV LAB;  Service: Open Heart Surgery;  Laterality: N/A;   MASTECTOMY Right 06/18/96   PACEMAKER IMPLANT N/A 07/03/2023   Procedure: PACEMAKER IMPLANT;  Surgeon: Inocencio Soyla Lunger, MD;  Location: [facility] INVASIVE CV LAB;  Service: Cardiovascular;  Laterality: N/A;   RIGHT HEART CATH AND CORONARY ANGIOGRAPHY N/A 05/07/2023   Procedure: RIGHT HEART CATH AND CORONARY ANGIOGRAPHY;  Surgeon: Lawrence Newman JINNY, MD;  Location: [facility] INVASIVE CV LAB;  Service: Cardiovascular;  Laterality:  N/A;   TONSILLECTOMY AND ADENOIDECTOMY     TRANSCATHETER AORTIC VALVE REPLACEMENT, TRANSFEMORAL N/A 07/02/2023   Procedure: Transcatheter Aortic Valve Replacement, Transfemoral;  Surgeon: Verlin Lonni BIRCH, MD;  Location: [facility] INVASIVE CV LAB;  Service: Open Heart Surgery;  Laterality: N/A;    Allergies  Allergies[1]  History of Present Illness    ***  Home Medications    Prior to Admission medications  Medication Sig Start Date End Date Taking? Authorizing Provider  acetaminophen  (TYLENOL ) 325 MG tablet Take 2 tablets (650 mg total) by mouth every 6 (six) hours as needed for mild pain (or Fever >/= 101). 08/01/17   Johnson, Clanford L, MD  amLODipine  (NORVASC ) 10 MG tablet Take 10 mg by mouth in the morning. 03/11/23   [provider]  aspirin  EC 81 MG tablet Take 1 tablet (81 mg total) by mouth daily. Swallow whole. 07/04/23   Sebastian Lamarr SAUNDERS, PA-C  carvedilol  (COREG ) 6.25 MG tablet Take 6.25 mg by mouth 2 (two) times daily with a meal.    [provider]  Cholecalciferol (VITAMIN D3) 50 MCG (2000 UT) capsule Take 2,000 Units by mouth in the morning.    [provider]  CRESTOR  20 MG tablet Take 20 mg by mouth in the morning. 02/23/21   [provider]  famotidine (PEPCID) 10 MG tablet Take 10 mg by mouth as needed for heartburn or indigestion.    [provider]  FARXIGA 5 MG TABS tablet Take 5  mg by mouth daily. 06/08/24   [provider]  furosemide  (LASIX ) 40 MG tablet Take 0.5 tablets (20 mg total) by mouth daily as needed for fluid or edema. 02/07/24   Sebastian Lamarr SAUNDERS, PA-C  hydrochlorothiazide  (HYDRODIURIL ) 25 MG tablet Take 1 tablet (25 mg total) by mouth every morning. 11/20/24   Patwardhan, Newman PARAS, MD  lisinopril  (ZESTRIL ) 40 MG tablet Take 40 mg by mouth in the morning.    [provider]  Multiple Vitamin (MULTIVITAMIN) capsule Take 1 capsule by mouth in the morning.    [provider]  repaglinide  (PRANDIN) 0.5 MG tablet Take 0.5 mg by mouth 2 (two) times daily. 04/09/24   [provider]    Family History    Family History  Problem Relation Age of Onset   Heart disease Mother    Heart disease Father    She indicated that her mother is deceased. She indicated that her father is deceased. She indicated that the status of her sister is unknown.  Social History    Social History   Socioeconomic History   Marital status: Widowed    Spouse name: Not on file   Number of children: 3   Years of education: Not on file   Highest education level: Not on file  Occupational History   Occupation: Retired-nursing geophysicist/field seismologist  Tobacco Use   Smoking status: Never   Smokeless tobacco: Never  Vaping Use   Vaping status: Never Used  Substance and Sexual Activity   Alcohol use: No   Drug use: No   Sexual activity: Never  Other Topics Concern   Not on file  Social History Narrative   Not on file   Social Drivers of Health   Tobacco Use: Low Risk (11/20/2024)   Patient History    Smoking Tobacco Use: Never    Smokeless Tobacco Use: Never    Passive Exposure: Not on file  Financial Resource Strain: Not on file  Food Insecurity: No Food Insecurity (07/04/2023)   Hunger Vital Sign    Worried About Running Out of Food in the Last Year: Never true    Ran Out of Food in the Last Year: Never true  Transportation Needs: No Transportation Needs (07/04/2023)   PRAPARE - Administrator, Civil Service (Medical): No    Lack of Transportation (Non-Medical): No  Physical Activity: Not on file  Stress: Not on file  Social Connections: Not on file  Intimate Partner Violence: Not on file  Depression (EYV7-0): Not on file  Alcohol Screen: Not on file  Housing: Low Risk (07/04/2023)   Housing    Last Housing Risk Score: 0  Utilities: Not At Risk (07/04/2023)   AHC Utilities    Threatened with loss of utilities: No  Health Literacy: Not on file     Review of Systems     General:  No chills, fever, night sweats or weight changes.  Cardiovascular:  No chest pain, dyspnea on exertion, edema, orthopnea, palpitations, paroxysmal nocturnal dyspnea. Dermatological: No rash, lesions/masses Respiratory: No cough, dyspnea Urologic: No hematuria, dysuria Abdominal:   No nausea, vomiting, diarrhea, bright red blood per rectum, melena, or hematemesis Neurologic:  No visual changes, wkns, changes in mental status. All other systems reviewed and are otherwise negative except as noted above.  Physical Exam    VS:  There were no vitals taken for this visit. , BMI There is no height or weight on file to calculate BMI. GEN: Well nourished, well  developed, in no acute distress. HEENT: normal. Neck: Supple, no JVD, carotid bruits, or masses. Cardiac: RRR, no murmurs, rubs, or gallops. No clubbing, cyanosis, edema.  Radials/DP/PT 2+ and equal bilaterally.  Respiratory:  Respirations regular and unlabored, clear to auscultation bilaterally. GI: Soft, nontender, nondistended, BS + x 4. MS: no deformity or atrophy. Skin: warm and dry, no rash. Neuro:  Strength and sensation are intact. Psych: Normal affect.  Accessory Clinical Findings    Recent Labs: 12/09/2024: BUN 17; Creatinine, Ser 1.30; Potassium 3.6; Sodium 143   Recent Lipid Panel    Component Value Date/Time   CHOL 157 11/30/2024 1002   TRIG 102 11/30/2024 1002   HDL 53 11/30/2024 1002   CHOLHDL 3.0 11/30/2024 1002   LDLCALC 85 11/30/2024 1002    No BP recorded.  {Refresh Note OR Click here to enter BP  :1}***    ECG personally reviewed by me today- ***          Assessment & Plan   1.  ***   Josefa HERO. Kloe Oates NP-C     12/24/2024, 8:44 AM [facility] [address] [place], [place] [address] Office [phone]    Notice: This dictation was prepared with Dragon dictation along with smaller phrase technology. Any transcriptional errors that result  from this process are unintentional and may not be corrected upon review.   I spent***minutes examining this patient, reviewing medications, and using patient centered shared decision making involving their cardiac care.   I spent  20 minutes reviewing past medical history,  medications, and prior cardiac tests.     [1] No Known Allergies  "

## 2024-12-28 ENCOUNTER — Ambulatory Visit: Admitting: General Practice

## 2024-12-31 ENCOUNTER — Ambulatory Visit: Payer: Self-pay | Admitting: Cardiology

## 2024-12-31 DIAGNOSIS — I442 Atrioventricular block, complete: Secondary | ICD-10-CM

## 2024-12-31 LAB — CUP PACEART REMOTE DEVICE CHECK
Battery Remaining Longevity: 116 mo
Battery Remaining Percentage: 90 %
Battery Voltage: 3.01 V
Brady Statistic AP VP Percent: 1 %
Brady Statistic AP VS Percent: 48 %
Brady Statistic AS VP Percent: 1 %
Brady Statistic AS VS Percent: 52 %
Brady Statistic RA Percent Paced: 47 %
Brady Statistic RV Percent Paced: 1 %
Date Time Interrogation Session: 20260129020020
Implantable Lead Connection Status: 753985
Implantable Lead Connection Status: 753985
Implantable Lead Implant Date: 20240731
Implantable Lead Implant Date: 20240731
Implantable Lead Location: 753859
Implantable Lead Location: 753860
Implantable Pulse Generator Implant Date: 20240731
Lead Channel Impedance Value: 460 Ohm
Lead Channel Impedance Value: 480 Ohm
Lead Channel Pacing Threshold Amplitude: 0.625 V
Lead Channel Pacing Threshold Amplitude: 0.75 V
Lead Channel Pacing Threshold Pulse Width: 0.5 ms
Lead Channel Pacing Threshold Pulse Width: 0.5 ms
Lead Channel Sensing Intrinsic Amplitude: 2.1 mV
Lead Channel Sensing Intrinsic Amplitude: 8.3 mV
Lead Channel Setting Pacing Amplitude: 1 V
Lead Channel Setting Pacing Amplitude: 1.625
Lead Channel Setting Pacing Pulse Width: 0.5 ms
Lead Channel Setting Sensing Sensitivity: 2 mV
Pulse Gen Model: 2272
Pulse Gen Serial Number: 5819091

## 2025-01-05 ENCOUNTER — Ambulatory Visit: Admitting: Cardiology

## 2025-01-05 NOTE — Progress Notes (Signed)
 Remote PPM Transmission

## 2025-02-15 ENCOUNTER — Ambulatory Visit: Admitting: Emergency Medicine

## 2025-04-08 ENCOUNTER — Ambulatory Visit: Admitting: Family Medicine
